# Patient Record
Sex: Male | Born: 1975 | Race: Black or African American | Hispanic: No | Marital: Single | Smoking: Never smoker
Health system: Southern US, Community
[De-identification: ages and names within clinical notes are randomized; demographics above are authoritative.]

## PROBLEM LIST (undated history)

## (undated) DIAGNOSIS — D571 Sickle-cell disease without crisis: Secondary | ICD-10-CM

## (undated) DIAGNOSIS — I10 Essential (primary) hypertension: Secondary | ICD-10-CM

## (undated) DIAGNOSIS — S5401XA Injury of ulnar nerve at forearm level, right arm, initial encounter: Secondary | ICD-10-CM

## (undated) DIAGNOSIS — E119 Type 2 diabetes mellitus without complications: Secondary | ICD-10-CM

## (undated) HISTORY — PX: KNEE ARTHROSCOPY: SHX127

## (undated) HISTORY — PX: ROTATOR CUFF REPAIR: SHX139

---

## 2013-03-21 DIAGNOSIS — M545 Low back pain, unspecified: Secondary | ICD-10-CM | POA: Insufficient documentation

## 2013-03-21 DIAGNOSIS — E1165 Type 2 diabetes mellitus with hyperglycemia: Secondary | ICD-10-CM | POA: Insufficient documentation

## 2013-03-21 DIAGNOSIS — E869 Volume depletion, unspecified: Secondary | ICD-10-CM | POA: Insufficient documentation

## 2013-03-21 DIAGNOSIS — H538 Other visual disturbances: Secondary | ICD-10-CM | POA: Insufficient documentation

## 2013-12-01 ENCOUNTER — Emergency Department: Payer: Self-pay | Admitting: Emergency Medicine

## 2013-12-01 LAB — CBC
HCT: 42.3 % (ref 40.0–52.0)
HGB: 13.2 g/dL (ref 13.0–18.0)
MCH: 25 pg — AB (ref 26.0–34.0)
MCHC: 31.2 g/dL — AB (ref 32.0–36.0)
MCV: 80 fL (ref 80–100)
Platelet: 314 10*3/uL (ref 150–440)
RBC: 5.27 10*6/uL (ref 4.40–5.90)
RDW: 19.3 % — ABNORMAL HIGH (ref 11.5–14.5)
WBC: 13.7 10*3/uL — ABNORMAL HIGH (ref 3.8–10.6)

## 2013-12-01 LAB — URINALYSIS, COMPLETE
BACTERIA: NONE SEEN
BLOOD: NEGATIVE
Bilirubin,UR: NEGATIVE
Ketone: NEGATIVE
Leukocyte Esterase: NEGATIVE
NITRITE: NEGATIVE
PROTEIN: NEGATIVE
Ph: 7 (ref 4.5–8.0)
RBC,UR: <1 /HPF (ref 0–5)
SPECIFIC GRAVITY: 1.02 (ref 1.003–1.030)
Squamous Epithelial: <1
WBC UR: <1 /HPF (ref 0–5)

## 2013-12-01 LAB — COMPREHENSIVE METABOLIC PANEL
ALBUMIN: 3.7 g/dL (ref 3.4–5.0)
ALK PHOS: 82 U/L
Anion Gap: 5 — ABNORMAL LOW (ref 7–16)
BUN: 17 mg/dL (ref 7–18)
Bilirubin,Total: 0.3 mg/dL (ref 0.2–1.0)
CALCIUM: 9.4 mg/dL (ref 8.5–10.1)
CREATININE: 1.4 mg/dL — AB (ref 0.60–1.30)
Chloride: 93 mmol/L — ABNORMAL LOW (ref 98–107)
Co2: 29 mmol/L (ref 21–32)
EGFR (Non-African Amer.): >60
Glucose: 602 mg/dL (ref 65–99)
OSMOLALITY: 285 (ref 275–301)
POTASSIUM: 4.3 mmol/L (ref 3.5–5.1)
SGOT(AST): 17 U/L (ref 15–37)
SGPT (ALT): 30 U/L (ref 12–78)
Sodium: 127 mmol/L — ABNORMAL LOW (ref 136–145)
TOTAL PROTEIN: 8.4 g/dL — AB (ref 6.4–8.2)

## 2013-12-01 LAB — TROPONIN I

## 2013-12-03 ENCOUNTER — Emergency Department: Payer: Self-pay | Admitting: Emergency Medicine

## 2016-05-30 ENCOUNTER — Emergency Department
Admission: EM | Admit: 2016-05-30 | Discharge: 2016-05-31 | Disposition: A | Discharge status: Home / Self Care | Payer: Self-pay | Attending: Emergency Medicine | Admitting: Emergency Medicine

## 2016-05-30 ENCOUNTER — Emergency Department: Payer: Self-pay

## 2016-05-30 DIAGNOSIS — R519 Headache, unspecified: Secondary | ICD-10-CM

## 2016-05-30 DIAGNOSIS — E119 Type 2 diabetes mellitus without complications: Secondary | ICD-10-CM | POA: Insufficient documentation

## 2016-05-30 DIAGNOSIS — R51 Headache: Secondary | ICD-10-CM | POA: Insufficient documentation

## 2016-05-30 DIAGNOSIS — M545 Low back pain: Secondary | ICD-10-CM | POA: Insufficient documentation

## 2016-05-30 LAB — CBC
HCT: 39 % — ABNORMAL LOW (ref 40.0–52.0)
HEMOGLOBIN: 13 g/dL (ref 13.0–18.0)
MCH: 26.7 pg (ref 26.0–34.0)
MCHC: 33.4 g/dL (ref 32.0–36.0)
MCV: 79.8 fL — ABNORMAL LOW (ref 80.0–100.0)
Platelets: 314 10*3/uL (ref 150–440)
RBC: 4.88 MIL/uL (ref 4.40–5.90)
RDW: 17.5 % — ABNORMAL HIGH (ref 11.5–14.5)
WBC: 18.2 10*3/uL — ABNORMAL HIGH (ref 3.8–10.6)

## 2016-05-30 NOTE — ED Triage Notes (Signed)
Patient reports woke today with generalized body aches and headache.  Patient denies any type of respiratory symptoms.

## 2016-05-31 LAB — BASIC METABOLIC PANEL
ANION GAP: 8 (ref 5–15)
BUN: 14 mg/dL (ref 6–20)
CHLORIDE: 100 mmol/L — AB (ref 101–111)
CO2: 28 mmol/L (ref 22–32)
Calcium: 9.2 mg/dL (ref 8.9–10.3)
Creatinine, Ser: 1.07 mg/dL (ref 0.61–1.24)
Glucose, Bld: 197 mg/dL — ABNORMAL HIGH (ref 65–99)
POTASSIUM: 3.9 mmol/L (ref 3.5–5.1)
SODIUM: 136 mmol/L (ref 135–145)

## 2016-05-31 LAB — INFLUENZA PANEL BY PCR (TYPE A & B)
H1N1FLUPCR: NOT DETECTED
INFLAPCR: NEGATIVE
INFLBPCR: NEGATIVE

## 2016-05-31 LAB — TROPONIN I

## 2016-05-31 MED ORDER — KETOROLAC TROMETHAMINE 30 MG/ML IJ SOLN
30.0000 mg | Freq: Once | INTRAMUSCULAR | Status: AC
  Administered 2016-05-31: 30 mg via INTRAVENOUS
  Filled 2016-05-31: qty 1

## 2016-05-31 MED ORDER — SODIUM CHLORIDE 0.9 % IV BOLUS (SEPSIS)
1000.0000 mL | Freq: Once | INTRAVENOUS | Status: AC
  Administered 2016-05-31: 1000 mL via INTRAVENOUS

## 2016-05-31 MED ORDER — DEXAMETHASONE SODIUM PHOSPHATE 10 MG/ML IJ SOLN
12.0000 mg | Freq: Once | INTRAMUSCULAR | Status: AC
  Administered 2016-05-31: 12 mg via INTRAVENOUS
  Filled 2016-05-31: qty 2

## 2016-05-31 MED ORDER — BUTALBITAL-APAP-CAFFEINE 50-325-40 MG PO TABS: 1.0000 | ORAL_TABLET | Freq: Four times a day (QID) | ORAL | 0 refills | Status: AC | PRN

## 2016-05-31 MED ORDER — MAGNESIUM SULFATE 2 GM/50ML IV SOLN
2.0000 g | Freq: Once | INTRAVENOUS | Status: AC
  Administered 2016-05-31: 2 g via INTRAVENOUS
  Filled 2016-05-31: qty 50

## 2016-05-31 MED ORDER — PROCHLORPERAZINE EDISYLATE 5 MG/ML IJ SOLN
10.0000 mg | Freq: Once | INTRAMUSCULAR | Status: AC
  Administered 2016-05-31: 10 mg via INTRAVENOUS
  Filled 2016-05-31: qty 2

## 2016-05-31 NOTE — ED Notes (Signed)
Pt states that he must have fallen asleep because he didn't remember me coming in and reconnecting his iv fluids, pt states that he is feeling better, cont to rest, lights turned now, NAD, cont to monitor

## 2016-05-31 NOTE — ED Provider Notes (Signed)
[facility] Emergency Department Provider Note   ____________________________________________   First MD Initiated Contact with Patient 05/30/16 2348     (approximate)  I have reviewed the triage vital signs and the nursing notes.   HISTORY  Chief Complaint Headache and Generalized Body Aches   HPI Carlos Hall is a 40 y.o. male with a history of migraine headaches was presented to emergency department today with diffuse body aches as well as a diffuse headache. He says that the headache started when he woke up this morning as a 5 out of 10. He says that the pain is throbbing and worse when he moves his head. He is denying any neck pain. Says that he also has diffuse body aches especially in his low back. Denies feeling sick lately. Denies any fever at home. Denies any stress or lack of sleep. Says that he has similar experience in 2011 when he was diagnosed with a migraine. Was unable to pinpoint the trigger for that event. Denies any drinking or tobacco use but does say that he smokes marijuana occasionally.   No past medical history on file.  There are no active problems to display for this patient.   No past surgical history on file.  Prior to Admission medications   Not on File    Allergies Review of patient's allergies indicates no known allergies.  No family history on file.  Social History Social History  Substance Use Topics  . Smoking status: Not on file  . Smokeless tobacco: Not on file  . Alcohol use Not on file    Review of Systems Constitutional: No fever/chills Eyes: No visual changes. ENT: No sore throat. Cardiovascular: Denies chest pain. Respiratory: Denies shortness of breath. Gastrointestinal: No abdominal pain.  No nausea, no vomiting.  No diarrhea.  No constipation. Genitourinary: Negative for dysuria. Musculoskeletal: Negative for back pain. Skin: Negative for rash. Neurological: Negative for focal weakness  or numbness.  10-point ROS otherwise negative.  ____________________________________________   PHYSICAL EXAM:  VITAL SIGNS: ED Triage Vitals  Enc Vitals Group     BP 05/30/16 2304 (!) 130/91     Pulse Rate 05/30/16 2304 78     Resp 05/30/16 2304 20     Temp 05/30/16 2304 98.1 F (36.7 C)     Temp Source 05/30/16 2304 Oral     SpO2 05/30/16 2304 100 %     Weight 05/30/16 2303 300 lb (136.1 kg)     Height 05/30/16 2303 5\' 11"  (1.803 m)     Head Circumference --      Peak Flow --      Pain Score 05/30/16 2303 10     Pain Loc --      Pain Edu? --      Excl. in GC? --    \ Constitutional: Alert and oriented. Well appearing and in no acute distress. Eyes: Conjunctivae are normal. PERRL. EOMI. Head: Atraumatic. Nose: No congestion/rhinnorhea. Mouth/Throat: Mucous membranes are moist.   Neck: No stridor.  Moves freely without any pain or restriction. Cardiovascular: Normal rate, regular rhythm. Grossly normal heart sounds.   Respiratory: Normal respiratory effort.  No retractions. Lungs CTAB. Gastrointestinal: Soft and nontender. No distention. No abdominal bruits. No CVA tenderness. Musculoskeletal: No lower extremity tenderness nor edema.  Tenderness to the low lumbar region. Neurologic:  Normal speech and language. No gross focal neurologic deficits are appreciated. Skin:  Skin is warm, dry and intact. No rash noted. Psychiatric: Mood and affect  are normal. Speech and behavior are normal.  ____________________________________________   LABS (all labs ordered are listed, but only abnormal results are displayed)  Labs Reviewed  BASIC METABOLIC PANEL - Abnormal; Notable for the following:       Result Value   Chloride 100 (*)    Glucose, Bld 197 (*)    All other components within normal limits  CBC - Abnormal; Notable for the following:    WBC 18.2 (*)    HCT 39.0 (*)    MCV 79.8 (*)    RDW 17.5 (*)    All other components within normal limits  TROPONIN I    INFLUENZA PANEL BY PCR (TYPE A & B, H1N1)   ____________________________________________  EKG  ED ECG REPORT I, Arelia LongestSchaevitz,  Fryda Molenda M, the attending physician, personally viewed and interpreted this ECG.   Date: 05/31/2016  EKG Time: 2302  Rate: 79  Rhythm: normal sinus rhythm  Axis: Normal  Intervals:none  ST&T Change: No ST segment elevation or depression. No abnormal T-wave inversion.  ____________________________________________  RADIOLOGY  DG Chest 2 View (Final result)  Result time 05/31/16 00:41:19  Final result by Rubye OaksMelanie Ehinger, MD (05/31/16 00:41:19)           Narrative:   CLINICAL DATA: Awoke today with chest pain and generalized body aches.  EXAM: CHEST 2 VIEW  COMPARISON: None.  FINDINGS: The cardiomediastinal contours are normal. The lungs are clear. Pulmonary vasculature is normal. No consolidation, pleural effusion, or pneumothorax. No acute osseous abnormalities are seen. Tacks in the right shoulder likely from rotator cuff repair.  IMPRESSION: No active cardiopulmonary disease.   Electronically Signed By: Rubye OaksMelanie Ehinger M.D. On: 05/31/2016 00:41          ____________________________________________   PROCEDURES  Procedure(s) performed:   Procedures  Critical Care performed:   ____________________________________________   INITIAL IMPRESSION / ASSESSMENT AND PLAN / ED COURSE  Pertinent labs & imaging results that were available during my care of the patient were reviewed by me and considered in my medical decision making (see chart for details).  White blood cell count of 18. Appears to have had an additionally elevated white blood cell count 2015.  ----------------------------------------- 1:23 AM on 05/31/2016 -----------------------------------------  Patient says that he is feeling slightly improved but still has 3 quarters of the bag of IV fluids to go. We'll reassess.  Clinical Course  Comment By Time   Patient says that his pain is down to 5 out of 10 after the first round of medications. We will give him second round of medications including Decadron and magnesium and an additional liter of fluid. Myrna Blazeravid Matthew Ellamay Fors, MD 10/09 [phone]    ----------------------------------------- 4:29 AM on 05/31/2016 -----------------------------------------  Patient now saying his pain is a 2-3 out of 10 in his head but completely relieved his body. Still not reporting any neck pain. Says that he feels off to go home at this point would like to be discharged. I also rechecked his temperature was 98.0. History of previous symptoms. Has a white blood cell count of 18.2 but no obvious infectious source. No lip lesions. No neck stiffness. Unlikely to be meningitis or encephalitis. His mental status intact. ____________________________________________   FINAL CLINICAL IMPRESSION(S) / ED DIAGNOSES  Headache.    NEW MEDICATIONS STARTED DURING THIS VISIT:  New Prescriptions   No medications on file     Note:  This document was prepared using Dragon voice recognition software and may include unintentional dictation errors.  Myrna Blazer, MD 05/31/16 0430

## 2016-05-31 NOTE — ED Notes (Signed)
Dr. Pershing ProudSchaevitz at pt's bedside at this time.

## 2016-05-31 NOTE — ED Notes (Signed)
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

## 2016-06-01 ENCOUNTER — Encounter: Payer: Self-pay | Admitting: Emergency Medicine

## 2016-06-01 ENCOUNTER — Emergency Department
Admission: EM | Admit: 2016-06-01 | Discharge: 2016-06-01 | Disposition: A | Discharge status: Home / Self Care | Payer: Self-pay | Attending: Emergency Medicine | Admitting: Emergency Medicine

## 2016-06-01 ENCOUNTER — Emergency Department: Payer: Self-pay

## 2016-06-01 DIAGNOSIS — R42 Dizziness and giddiness: Secondary | ICD-10-CM | POA: Insufficient documentation

## 2016-06-01 DIAGNOSIS — R519 Headache, unspecified: Secondary | ICD-10-CM

## 2016-06-01 DIAGNOSIS — R51 Headache: Secondary | ICD-10-CM | POA: Insufficient documentation

## 2016-06-01 DIAGNOSIS — E119 Type 2 diabetes mellitus without complications: Secondary | ICD-10-CM | POA: Insufficient documentation

## 2016-06-01 DIAGNOSIS — G4452 New daily persistent headache (NDPH): Secondary | ICD-10-CM

## 2016-06-01 HISTORY — DX: Injury of ulnar nerve at forearm level, right arm, initial encounter: S54.01XA

## 2016-06-01 HISTORY — DX: Type 2 diabetes mellitus without complications: E11.9

## 2016-06-01 LAB — CBC WITH DIFFERENTIAL/PLATELET
BASOS ABS: 0 10*3/uL (ref 0–0.1)
Basophils Relative: 0 %
EOS PCT: 1 %
Eosinophils Absolute: 0.1 10*3/uL (ref 0–0.7)
HEMATOCRIT: 38.3 % — AB (ref 40.0–52.0)
Hemoglobin: 12.9 g/dL — ABNORMAL LOW (ref 13.0–18.0)
LYMPHS ABS: 1.6 10*3/uL (ref 1.0–3.6)
LYMPHS PCT: 16 %
MCH: 26.8 pg (ref 26.0–34.0)
MCHC: 33.8 g/dL (ref 32.0–36.0)
MCV: 79.2 fL — AB (ref 80.0–100.0)
MONO ABS: 0.5 10*3/uL (ref 0.2–1.0)
Monocytes Relative: 5 %
NEUTROS ABS: 8.2 10*3/uL — AB (ref 1.4–6.5)
Neutrophils Relative %: 78 %
PLATELETS: 294 10*3/uL (ref 150–440)
RBC: 4.83 MIL/uL (ref 4.40–5.90)
RDW: 17.2 % — ABNORMAL HIGH (ref 11.5–14.5)
WBC: 10.5 10*3/uL (ref 3.8–10.6)

## 2016-06-01 LAB — BASIC METABOLIC PANEL
ANION GAP: 7 (ref 5–15)
BUN: 15 mg/dL (ref 6–20)
CHLORIDE: 102 mmol/L (ref 101–111)
CO2: 27 mmol/L (ref 22–32)
Calcium: 9 mg/dL (ref 8.9–10.3)
Creatinine, Ser: 1.05 mg/dL (ref 0.61–1.24)
GFR calc Af Amer: >60 mL/min (ref >60)
GFR calc non Af Amer: >60 mL/min (ref >60)
GLUCOSE: 163 mg/dL — AB (ref 65–99)
POTASSIUM: 3.9 mmol/L (ref 3.5–5.1)
Sodium: 136 mmol/L (ref 135–145)

## 2016-06-01 MED ORDER — METOCLOPRAMIDE HCL 5 MG/ML IJ SOLN
10.0000 mg | Freq: Once | INTRAMUSCULAR | Status: AC
  Administered 2016-06-01: 10 mg via INTRAVENOUS
  Filled 2016-06-01: qty 2

## 2016-06-01 MED ORDER — MECLIZINE HCL 25 MG PO TABS: 25.0000 mg | ORAL_TABLET | Freq: Three times a day (TID) | ORAL | 0 refills | Status: DC | PRN

## 2016-06-01 MED ORDER — RIZATRIPTAN BENZOATE 10 MG PO TABS: 10.0000 mg | ORAL_TABLET | Freq: Once | ORAL | 0 refills | Status: AC | PRN

## 2016-06-01 MED ORDER — KETOROLAC TROMETHAMINE 30 MG/ML IJ SOLN
30.0000 mg | Freq: Once | INTRAMUSCULAR | Status: AC
  Administered 2016-06-01: 30 mg via INTRAVENOUS
  Filled 2016-06-01: qty 1

## 2016-06-01 MED ORDER — DEXAMETHASONE SODIUM PHOSPHATE 10 MG/ML IJ SOLN
10.0000 mg | Freq: Once | INTRAMUSCULAR | Status: AC
  Administered 2016-06-01: 10 mg via INTRAVENOUS
  Filled 2016-06-01: qty 1

## 2016-06-01 NOTE — ED Triage Notes (Signed)
Patient reports headache since Sunday. Came here Sunday night left with headache. Has tried OTC allergy medicine. Patient reports eye pain and hearing problems off and on. Patient unable to sleep, pain worse when laying down.

## 2016-06-01 NOTE — ED Provider Notes (Signed)
[facility] Emergency Department Provider Note   ____________________________________________   None    (approximate)  I have reviewed the triage vital signs and the nursing notes.   HISTORY  Chief Complaint Headache    HPI Carlos Hall is a 40 y.o. male presents for evaluation of continued headache 2 days. Patient was here 2 days ago for the same states his headache slightly relieved but has continued. Patient states that his prescription for Fioricet is not touching his headache. He denies any nausea vomiting states that he is just unable to sleep and has episodes of vertigo or dizziness when he changes position.   Past Medical History:  Diagnosis Date  . 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 Diabetes mellitus without complication (HCC)    type 2    There are no active problems to display for this patient.   Past Surgical History:  Procedure Laterality Date  . KNEE ARTHROSCOPY    . ROTATOR CUFF REPAIR      Prior to Admission medications   Medication Sig Start Date End Date Taking? Authorizing Provider  butalbital-acetaminophen-caffeine (FIORICET, ESGIC) 50-325-40 MG tablet Take 1-2 tablets by mouth every 6 (six) hours as needed for headache. 05/31/16 05/31/17  Myrna Blazer, MD  meclizine (ANTIVERT) 25 MG tablet Take 1 tablet (25 mg total) by mouth 3 (three) times daily as needed for dizziness. 06/01/16   Evangeline Dakin, PA-C  rizatriptan (MAXALT) 10 MG tablet Take 1 tablet (10 mg total) by mouth once as needed for migraine. May repeat in 2 hours if needed 06/01/16 06/02/17  Evangeline Dakin, PA-C    Allergies Review of patient's allergies indicates no known allergies.  No family history on file.  Social History Social History  Substance Use Topics  . Smoking status: Never Smoker  . Smokeless tobacco: Never Used  . Alcohol use No    Review of Systems Constitutional: No fever/chills Eyes: No visual changes. ENT: No sore  throat. Cardiovascular: Denies chest pain. Respiratory: Denies shortness of breath. Gastrointestinal: No abdominal pain.  No nausea, no vomiting.  No diarrhea.  No constipation. Genitourinary: Negative for dysuria. Musculoskeletal: Negative for back pain. Skin: Negative for rash. Neurological: Negative for headaches, focal weakness or numbness.Positive for positional vertigo and dizziness.  10-point ROS otherwise negative.  ____________________________________________   PHYSICAL EXAM:  VITAL SIGNS: ED Triage Vitals [06/01/16 1121]  Enc Vitals Group     BP (!) 147/93     Pulse Rate 73     Resp 15     Temp 98.2 F (36.8 C)     Temp Source Oral     SpO2 97 %     Weight 300 lb (136.1 kg)     Height 5\' 11"  (1.803 m)     Head Circumference      Peak Flow      Pain Score 8     Pain Loc      Pain Edu?      Excl. in GC?     Constitutional: Alert and oriented. Well appearing and in no acute distress. Eyes: Conjunctivae are normal. PERRL. EOMI. Head: Atraumatic. Nose: No congestion/rhinnorhea. Mouth/Throat: Mucous membranes are moist.  Oropharynx non-erythematous. Neck: No stridor.   Cardiovascular: Normal rate, regular rhythm. Grossly normal heart sounds.  Good peripheral circulation. Respiratory: Normal respiratory effort.  No retractions. Lungs CTAB. Gastrointestinal: Soft and nontender. No distention. No abdominal bruits. No CVA tenderness. Musculoskeletal: No lower extremity tenderness nor edema.  No joint effusions. Neurologic:  Normal speech and language. No gross focal neurologic deficits are appreciated. No gait instability. Skin:  Skin is warm, dry and intact. No rash noted. Psychiatric: Mood and affect are normal. Speech and behavior are normal.  ____________________________________________   LABS (all labs ordered are listed, but only abnormal results are displayed)  Labs Reviewed  BASIC METABOLIC PANEL - Abnormal; Notable for the following:       Result Value    Glucose, Bld 163 (*)    All other components within normal limits  CBC WITH DIFFERENTIAL/PLATELET - Abnormal; Notable for the following:    Hemoglobin 12.9 (*)    HCT 38.3 (*)    MCV 79.2 (*)    RDW 17.2 (*)    Neutro Abs 8.2 (*)    All other components within normal limits   ____________________________________________  EKG   ____________________________________________  RADIOLOGY  Head CT demonstrates no acute intracranial process. ____________________________________________   PROCEDURES  Procedure(s) performed: None  Procedures  Critical Care performed: No  ____________________________________________   INITIAL IMPRESSION / ASSESSMENT AND PLAN / ED COURSE  Pertinent labs & imaging results that were available during my care of the patient were reviewed by me and considered in my medical decision making (see chart for details).  Recurrent headaches of etiology unknown. [facility]. Rx given for Maxalt and meclizine. Patient follow-up as directed. Patient states mild improvement from treatment with Toradol and Reglan and Decadron.  Clinical Course     ____________________________________________   FINAL CLINICAL IMPRESSION(S) / ED DIAGNOSES  Final diagnoses:  New daily persistent headache  Headache disorder      NEW MEDICATIONS STARTED DURING THIS VISIT:  New Prescriptions   MECLIZINE (ANTIVERT) 25 MG TABLET    Take 1 tablet (25 mg total) by mouth 3 (three) times daily as needed for dizziness.   RIZATRIPTAN (MAXALT) 10 MG TABLET    Take 1 tablet (10 mg total) by mouth once as needed for migraine. May repeat in 2 hours if needed     Note:  This document was prepared using Dragon voice recognition software and may include unintentional dictation errors.   Evangeline Dakinharles M Murline Weigel, PA-C 06/01/16 1320    Jene Everyobert Kinner, MD 06/01/16 [phone]

## 2016-09-22 ENCOUNTER — Emergency Department: Payer: Medicaid Other

## 2016-09-22 ENCOUNTER — Emergency Department
Admission: EM | Admit: 2016-09-22 | Discharge: 2016-09-22 | Disposition: A | Discharge status: Home / Self Care | Payer: Medicaid Other | Attending: Emergency Medicine | Admitting: Emergency Medicine

## 2016-09-22 ENCOUNTER — Encounter: Payer: Self-pay | Admitting: Emergency Medicine

## 2016-09-22 DIAGNOSIS — M25532 Pain in left wrist: Secondary | ICD-10-CM | POA: Diagnosis not present

## 2016-09-22 DIAGNOSIS — R531 Weakness: Secondary | ICD-10-CM | POA: Diagnosis not present

## 2016-09-22 DIAGNOSIS — E119 Type 2 diabetes mellitus without complications: Secondary | ICD-10-CM | POA: Insufficient documentation

## 2016-09-22 MED ORDER — MELOXICAM 15 MG PO TABS: 15.0000 mg | ORAL_TABLET | Freq: Every day | ORAL | 0 refills | Status: DC

## 2016-09-22 MED ORDER — TRAMADOL HCL 50 MG PO TABS: 50.0000 mg | ORAL_TABLET | Freq: Four times a day (QID) | ORAL | 0 refills | Status: DC | PRN
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
## 2016-09-22 NOTE — ED Triage Notes (Signed)
Presents with pain and swelling to left wrist w/o injury

## 2016-09-22 NOTE — Discharge Instructions (Signed)
Wear the splint when out of bed. Follow up with orthopedics when possible if pain is not improving. Return to the ER for symptoms that change or worsen.

## 2017-01-17 ENCOUNTER — Other Ambulatory Visit: Payer: Self-pay | Admitting: Orthopedic Surgery

## 2017-01-17 ENCOUNTER — Ambulatory Visit
Admission: RE | Admit: 2017-01-17 | Discharge: 2017-01-17 | Disposition: A | Discharge status: Home / Self Care | Payer: Disability Insurance | Source: Ambulatory Visit | Attending: Orthopedic Surgery | Admitting: Orthopedic Surgery

## 2017-01-17 ENCOUNTER — Ambulatory Visit
Admission: RE | Admit: 2017-01-17 | Discharge: 2017-01-17 | Disposition: A | Discharge status: Home / Self Care | Payer: Disability Insurance | Source: Ambulatory Visit | Attending: *Deleted | Admitting: *Deleted

## 2017-01-17 ENCOUNTER — Other Ambulatory Visit: Payer: Self-pay | Admitting: *Deleted

## 2017-01-17 DIAGNOSIS — M6281 Muscle weakness (generalized): Secondary | ICD-10-CM | POA: Diagnosis not present

## 2017-01-17 DIAGNOSIS — M25511 Pain in right shoulder: Secondary | ICD-10-CM

## 2017-01-17 DIAGNOSIS — R937 Abnormal findings on diagnostic imaging of other parts of musculoskeletal system: Secondary | ICD-10-CM | POA: Diagnosis not present

## 2017-01-17 DIAGNOSIS — E119 Type 2 diabetes mellitus without complications: Secondary | ICD-10-CM | POA: Insufficient documentation

## 2017-01-17 DIAGNOSIS — R200 Anesthesia of skin: Secondary | ICD-10-CM | POA: Insufficient documentation

## 2017-01-17 DIAGNOSIS — S8991XA Unspecified injury of right lower leg, initial encounter: Secondary | ICD-10-CM | POA: Diagnosis not present

## 2017-01-17 DIAGNOSIS — G589 Mononeuropathy, unspecified: Secondary | ICD-10-CM | POA: Insufficient documentation

## 2017-01-17 DIAGNOSIS — M25561 Pain in right knee: Secondary | ICD-10-CM

## 2017-01-17 DIAGNOSIS — X58XXXA Exposure to other specified factors, initial encounter: Secondary | ICD-10-CM | POA: Diagnosis not present

## 2017-08-23 ENCOUNTER — Encounter: Payer: Self-pay | Admitting: Emergency Medicine

## 2017-08-23 ENCOUNTER — Emergency Department
Admission: EM | Admit: 2017-08-23 | Discharge: 2017-08-23 | Disposition: A | Discharge status: Home / Self Care | Payer: Medicaid Other | Attending: Emergency Medicine | Admitting: Emergency Medicine

## 2017-08-23 DIAGNOSIS — E1065 Type 1 diabetes mellitus with hyperglycemia: Secondary | ICD-10-CM | POA: Diagnosis not present

## 2017-08-23 DIAGNOSIS — R739 Hyperglycemia, unspecified: Secondary | ICD-10-CM

## 2017-08-23 DIAGNOSIS — Z794 Long term (current) use of insulin: Secondary | ICD-10-CM | POA: Diagnosis not present

## 2017-08-23 DIAGNOSIS — Z791 Long term (current) use of non-steroidal anti-inflammatories (NSAID): Secondary | ICD-10-CM | POA: Diagnosis not present

## 2017-08-23 DIAGNOSIS — E109 Type 1 diabetes mellitus without complications: Secondary | ICD-10-CM

## 2017-08-23 LAB — BASIC METABOLIC PANEL WITH GFR
Anion gap: 9 (ref 5–15)
BUN: 16 mg/dL (ref 6–20)
CO2: 25 mmol/L (ref 22–32)
Calcium: 9 mg/dL (ref 8.9–10.3)
Chloride: 99 mmol/L — ABNORMAL LOW (ref 101–111)
Creatinine, Ser: 0.96 mg/dL (ref 0.61–1.24)
GFR calc Af Amer: >60 mL/min
GFR calc non Af Amer: >60 mL/min
Glucose, Bld: 295 mg/dL — ABNORMAL HIGH (ref 65–99)
Potassium: 3.9 mmol/L (ref 3.5–5.1)
Sodium: 133 mmol/L — ABNORMAL LOW (ref 135–145)

## 2017-08-23 LAB — CBC
HCT: 40 % (ref 40.0–52.0)
Hemoglobin: 13.1 g/dL (ref 13.0–18.0)
MCH: 26 pg (ref 26.0–34.0)
MCHC: 32.8 g/dL (ref 32.0–36.0)
MCV: 79.3 fL — ABNORMAL LOW (ref 80.0–100.0)
Platelets: 318 10*3/uL (ref 150–440)
RBC: 5.04 MIL/uL (ref 4.40–5.90)
RDW: 17.1 % — ABNORMAL HIGH (ref 11.5–14.5)
WBC: 12.2 10*3/uL — ABNORMAL HIGH (ref 3.8–10.6)

## 2017-08-23 LAB — GLUCOSE, CAPILLARY
GLUCOSE-CAPILLARY: 185 mg/dL — AB (ref 65–99)
Glucose-Capillary: 302 mg/dL — ABNORMAL HIGH (ref 65–99)
Glucose-Capillary: 155 mg/dL — ABNORMAL HIGH (ref 65–99)

## 2017-08-23 LAB — URINALYSIS, COMPLETE (UACMP) WITH MICROSCOPIC
Bacteria, UA: NONE SEEN
Bilirubin Urine: NEGATIVE
Glucose, UA: >=500 mg/dL — AB
Hgb urine dipstick: NEGATIVE
Ketones, ur: NEGATIVE mg/dL
Leukocytes, UA: NEGATIVE
Nitrite: NEGATIVE
Protein, ur: NEGATIVE mg/dL
Specific Gravity, Urine: 1.013 (ref 1.005–1.030)
Squamous Epithelial / HPF: NONE SEEN
pH: 6 (ref 5.0–8.0)

## 2017-08-23 MED ORDER — BLOOD GLUCOSE MONITOR KIT: PACK | 0 refills | Status: DC

## 2017-08-23 MED ORDER — SODIUM CHLORIDE 0.9 % IV BOLUS (SEPSIS)
1000.0000 mL | Freq: Once | INTRAVENOUS | Status: AC
  Administered 2017-08-23: 1000 mL via INTRAVENOUS

## 2017-08-23 MED ORDER — IBUPROFEN 600 MG PO TABS
600.0000 mg | ORAL_TABLET | ORAL | Status: AC
  Administered 2017-08-23: 600 mg via ORAL
  Filled 2017-08-23: qty 1

## 2017-08-23 MED ORDER — INSULIN ASPART 100 UNIT/ML ~~LOC~~ SOLN
8.0000 [IU] | Freq: Once | SUBCUTANEOUS | Status: AC
  Administered 2017-08-23: 8 [IU] via INTRAVENOUS
  Filled 2017-08-23: qty 1

## 2017-08-23 NOTE — ED Notes (Signed)
ED Provider at bedside. 

## 2017-08-23 NOTE — Discharge Instructions (Signed)
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
## 2017-08-23 NOTE — ED Provider Notes (Signed)
[facility] Emergency Department Provider Note ____________________________________________   First MD Initiated Contact with Patient 08/23/17 1501     (approximate)  I have reviewed the triage vital signs and the nursing notes.   HISTORY  Chief Complaint Hyperglycemia  HPI Carlos Hall is a 42 y.o. male presents for evaluation of increased urination, feeling thirsty, and feeling achy for about 1 week.  Patient reports that his glucometer stopped working about 1 week ago.  He has not been able to monitor his blood sugars closely, but believes they have been quite elevated.  He takes 7030 insulin twice daily, reports he uses 30 units at a time.  He has his prescriptions, but is not been able to administer his doses as regularly as his glucometer is not been working.  Denies fevers or chills.  No abdominal pain no nausea vomiting.  Reports he feels like he is "dehydrated".  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  No cough.  No runny nose.    Past Medical History:  Diagnosis Date  . 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 Diabetes mellitus without complication (HCC)    type 2    There are no active problems to display for this patient.   Past Surgical History:  Procedure Laterality Date  . KNEE ARTHROSCOPY    . ROTATOR CUFF REPAIR      Prior to Admission medications   Medication Sig Start Date End Date Taking? Authorizing Provider  meclizine (ANTIVERT) 25 MG tablet Take 1 tablet (25 mg total) by mouth 3 (three) times daily as needed for dizziness. 06/01/16   Beers, Charmayne Sheerharles M, PA-C  meloxicam (MOBIC) 15 MG tablet Take 1 tablet (15 mg total) by mouth daily. 09/22/16   Triplett, Rulon Eisenmengerari B, FNP  traMADol ([name]) 50 MG tablet Take 1 tablet (50 mg total) by mouth every 6 (six) hours as needed. 09/22/16   Kem Boroughsriplett, Cari B, FNP  Patient reports twice daily insulin use  Allergies Patient has no known allergies.  No  family history on file.  Social History Social History   Tobacco Use  . Smoking status: Never Smoker  . Smokeless tobacco: Never Used  Substance Use Topics  . Alcohol use: No  . Drug use: Yes    Types: Marijuana    Review of Systems Constitutional: No fever/chills but does feel fatigue Eyes: No visual changes. ENT: No sore throat.  Dry mouth. Cardiovascular: Denies chest pain. Respiratory: Denies shortness of breath. Gastrointestinal: No abdominal pain.  No nausea, no vomiting.  No diarrhea.  No constipation. Genitourinary: Negative for dysuria.  Frequent urination. Musculoskeletal: Negative for back pain. Skin: Negative for rash. Neurological: Negative for headaches, focal weakness or numbness.  ____________________________________________   PHYSICAL EXAM:  VITAL SIGNS: ED Triage Vitals  Enc Vitals Group     BP 08/23/17 1209 (!) 150/96     Pulse Rate 08/23/17 1209 92     Resp 08/23/17 1209 18     Temp 08/23/17 1209 98.4 F (36.9 C)     Temp Source 08/23/17 1209 Oral     SpO2 08/23/17 1209 99 %     Weight --      Height --      Head Circumference --      Peak Flow --      Pain Score 08/23/17 1217 7     Pain Loc --      Pain Edu? --  Excl. in [place]? --     Constitutional: Alert and oriented. Well appearing and in no acute distress.  Very pleasant Eyes: Conjunctivae are normal. Head: Atraumatic. Nose: No congestion/rhinnorhea. Mouth/Throat: Mucous membranes are dry. Neck: No stridor.   Cardiovascular: Normal rate, regular rhythm. Grossly normal heart sounds.  Good peripheral circulation. Respiratory: Normal respiratory effort.  No retractions. Lungs CTAB. Gastrointestinal: Soft and nontender. No distention. Musculoskeletal: No lower extremity tenderness nor edema. Neurologic:  Normal speech and language. No gross focal neurologic deficits are appreciated.  Skin:  Skin is warm, dry and intact. No rash noted. Psychiatric: Mood and affect are normal. Speech  and behavior are normal.  ____________________________________________   LABS (all labs ordered are listed, but only abnormal results are displayed)  Labs Reviewed  BASIC METABOLIC PANEL - Abnormal; Notable for the following components:      Result Value   Sodium 133 (*)    Chloride 99 (*)    Glucose, Bld 295 (*)    All other components within normal limits  CBC - Abnormal; Notable for the following components:   WBC 12.2 (*)    MCV 79.3 (*)    RDW 17.1 (*)    All other components within normal limits  URINALYSIS, COMPLETE (UACMP) WITH MICROSCOPIC - Abnormal; Notable for the following components:   Color, Urine YELLOW (*)    APPearance CLEAR (*)    Glucose, UA >=500 (*)    All other components within normal limits  GLUCOSE, CAPILLARY - Abnormal; Notable for the following components:   Glucose-Capillary 302 (*)    All other components within normal limits  GLUCOSE, CAPILLARY - Abnormal; Notable for the following components:   Glucose-Capillary 155 (*)    All other components within normal limits  CBG MONITORING, ED  CBG MONITORING, ED  CBG MONITORING, ED  CBG MONITORING, ED   ____________________________________________  EKG   ____________________________________________  RADIOLOGY   ____________________________________________   PROCEDURES  Procedure(s) performed: None  Procedures  Critical Care performed: No  ____________________________________________   INITIAL IMPRESSION / ASSESSMENT AND PLAN / ED COURSE  Pertinent labs & imaging results that were available during my care of the patient were reviewed by me and considered in my medical decision making (see chart for details).  Patient presents for evaluation of polydypsia polyuria.  Known diabetic and reports that he has not been able to administer his insulin as usual due to glucometer breaking.  Does report some slight muscle aches with a but no other infectious or systemic symptoms other than  feeling dry and dehydrated.  He mechanically stable, normal anion gap, normal potassium and bicarbonate.  No evidence of DKA.  No alteration in mental status.  We will hydrate, provide IV insulin, will recheck his blood sugar shortly thereafter and I suspect he should be able to go home and counseled him on purchasing a glucometer at [facility], for which she is agreeable and states that he has the funds to do.  He reports that he does not need a refill on his insulin, but rather broke glucometer.  Clinical Course as of Aug 23 1733  Tue Aug 23, 2017  1500 CO2: 25 [MQ]  1500 Potassium: 3.9 [MQ]  1500 Glucose: (!) 295 [MQ]  1500 Anion gap: 9 [MQ]    Clinical Course User Index [MQ] Sharyn Creamer, MD    No neurologic, cardiac pulmonary or infectious symptoms associated.   ----------------------------------------- 5:35 PM on 08/23/2017 -----------------------------------------  Patient blood glucose improved, resting comfortably.  Stable glucose  on recheck.  Patient reports he has insulin, just had not had his glucometer.  Counseled, advised on return precautions and need to go purchase his glucometer and continue to use it on a regular basis.  Patient in agreement.  Reports improvement, stable hemodynamics and no distress. ____________________________________________   FINAL CLINICAL IMPRESSION(S) / ED DIAGNOSES  Final diagnoses:  Type 1 diabetes mellitus without complication (HCC)  Hyperglycemia      NEW MEDICATIONS STARTED DURING THIS VISIT:  This SmartLink is deprecated. Use AVSMEDLIST instead to display the medication list for a patient.   Note:  This document was prepared using Dragon voice recognition software and may include unintentional dictation errors.     Sharyn Creamer, MD 08/23/17 1736

## 2017-11-03 ENCOUNTER — Encounter: Payer: Self-pay | Admitting: *Deleted

## 2017-11-03 DIAGNOSIS — H538 Other visual disturbances: Secondary | ICD-10-CM | POA: Diagnosis present

## 2017-11-03 DIAGNOSIS — E119 Type 2 diabetes mellitus without complications: Secondary | ICD-10-CM | POA: Insufficient documentation

## 2017-11-03 DIAGNOSIS — R739 Hyperglycemia, unspecified: Secondary | ICD-10-CM | POA: Insufficient documentation

## 2017-11-03 LAB — CBC
HEMATOCRIT: 42.4 % (ref 40.0–52.0)
Hemoglobin: 13.6 g/dL (ref 13.0–18.0)
MCH: 26.1 pg (ref 26.0–34.0)
MCHC: 32 g/dL (ref 32.0–36.0)
MCV: 81.3 fL (ref 80.0–100.0)
Platelets: 337 10*3/uL (ref 150–440)
RBC: 5.21 MIL/uL (ref 4.40–5.90)
RDW: 17.2 % — ABNORMAL HIGH (ref 11.5–14.5)
WBC: 13.1 10*3/uL — AB (ref 3.8–10.6)

## 2017-11-03 LAB — URINALYSIS, COMPLETE (UACMP) WITH MICROSCOPIC
BILIRUBIN URINE: NEGATIVE
Bacteria, UA: NONE SEEN
Glucose, UA: >=500 mg/dL — AB
HGB URINE DIPSTICK: NEGATIVE
KETONES UR: NEGATIVE mg/dL
LEUKOCYTES UA: NEGATIVE
NITRITE: NEGATIVE
Protein, ur: NEGATIVE mg/dL
RBC / HPF: NONE SEEN RBC/hpf (ref 0–5)
SPECIFIC GRAVITY, URINE: 1.022 (ref 1.005–1.030)
Squamous Epithelial / LPF: NONE SEEN
WBC, UA: NONE SEEN WBC/hpf (ref 0–5)
pH: 6 (ref 5.0–8.0)

## 2017-11-03 LAB — COMPREHENSIVE METABOLIC PANEL
ALBUMIN: 3.6 g/dL (ref 3.5–5.0)
ALK PHOS: 83 U/L (ref 38–126)
ALT: 23 U/L (ref 17–63)
ANION GAP: 11 (ref 5–15)
AST: 28 U/L (ref 15–41)
BILIRUBIN TOTAL: 0.4 mg/dL (ref 0.3–1.2)
BUN: 16 mg/dL (ref 6–20)
CO2: 25 mmol/L (ref 22–32)
Calcium: 9.2 mg/dL (ref 8.9–10.3)
Chloride: 93 mmol/L — ABNORMAL LOW (ref 101–111)
Creatinine, Ser: 1.15 mg/dL (ref 0.61–1.24)
GFR calc non Af Amer: >60 mL/min (ref >60)
GLUCOSE: 692 mg/dL — AB (ref 65–99)
POTASSIUM: 4 mmol/L (ref 3.5–5.1)
SODIUM: 129 mmol/L — AB (ref 135–145)
TOTAL PROTEIN: 7.8 g/dL (ref 6.5–8.1)

## 2017-11-03 LAB — GLUCOSE, CAPILLARY: Glucose-Capillary: >600 mg/dL (ref 65–99)

## 2017-11-03 MED ORDER — SODIUM CHLORIDE 0.9 % IV BOLUS (SEPSIS)
1000.0000 mL | Freq: Once | INTRAVENOUS | Status: AC
  Administered 2017-11-03: 1000 mL via INTRAVENOUS

## 2017-11-03 NOTE — ED Triage Notes (Signed)
Pt reports he feels like his blood sugar is elevated because he has been urinating frequently, has blurry vision in both eyes and has body aches. He has not taken insulin in "about 2-3 years" because he cannot afford it.   Glucose reading "hi" in triage.

## 2017-11-04 ENCOUNTER — Emergency Department
Admission: EM | Admit: 2017-11-04 | Discharge: 2017-11-04 | Disposition: A | Discharge status: Home / Self Care | Payer: Medicaid Other | Attending: Emergency Medicine | Admitting: Emergency Medicine

## 2017-11-04 DIAGNOSIS — R739 Hyperglycemia, unspecified: Secondary | ICD-10-CM

## 2017-11-04 LAB — GLUCOSE, CAPILLARY
GLUCOSE-CAPILLARY: 340 mg/dL — AB (ref 65–99)
GLUCOSE-CAPILLARY: 347 mg/dL — AB (ref 65–99)

## 2017-11-04 MED ORDER — SODIUM CHLORIDE 0.9 % IV BOLUS (SEPSIS)
1000.0000 mL | Freq: Once | INTRAVENOUS | Status: AC
  Administered 2017-11-04: 1000 mL via INTRAVENOUS

## 2017-11-04 MED ORDER — INSULIN ASPART 100 UNIT/ML ~~LOC~~ SOLN: SUBCUTANEOUS | 3 refills | Status: DC

## 2017-11-04 MED ORDER — INSULIN ASPART 100 UNIT/ML ~~LOC~~ SOLN
10.0000 [IU] | Freq: Once | SUBCUTANEOUS | Status: AC
  Administered 2017-11-04: 10 [IU] via INTRAVENOUS
  Filled 2017-11-04: qty 1

## 2017-11-04 NOTE — ED Provider Notes (Signed)
[facility] Emergency Department Provider Note _   First MD Initiated Contact with Patient 11/04/17 0023     (approximate)  I have reviewed the triage vital signs and the nursing notes.   HISTORY  Chief Complaint Blurred Vision   HPI Carlos Hall is a 42 y.o. male with history of diabetes mellitus type 2 presents to the emergency department with polydipsia polyuria and markedly elevated glucose accompanied by bilateral blurred vision.  Patient states that he has been without his insulin for greater than 1 week secondary to cost.  Patient denies any recent illness   Past Medical History:  Diagnosis Date  . 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 Diabetes mellitus without complication ([place])    type 2    There are no active problems to display for this patient.   Past Surgical History:  Procedure Laterality Date  . KNEE ARTHROSCOPY    . ROTATOR CUFF REPAIR      Prior to Admission medications   Medication Sig Start Date End Date Taking? Authorizing Provider  blood glucose meter kit and supplies KIT Dispense based on patient and insurance preference. Use up to four times daily as directed. (FOR ICD-9 250.00, 250.01). 08/23/17  Yes Delman Kitten, MD    Allergies No known drug allergies No family history on file.  Social History Social History   Tobacco Use  . Smoking status: Never Smoker  . Smokeless tobacco: Never Used  Substance Use Topics  . Alcohol use: No  . Drug use: Yes    Types: Marijuana    Review of Systems Constitutional: No fever/chills Eyes: No visual changes. ENT: No sore throat. Cardiovascular: Denies chest pain. Respiratory: Denies shortness of breath. Gastrointestinal: No abdominal pain.  No nausea, no vomiting.  No diarrhea.  No constipation. Genitourinary: Negative for dysuria. Musculoskeletal: Negative for neck pain.  Negative for back pain. Integumentary: Negative for rash. Neurological: Negative for headaches, focal  weakness or numbness. Endocrine:Positive for hyperglycemia polyuria polydipsia   ____________________________________________   PHYSICAL EXAM:  VITAL SIGNS: ED Triage Vitals  Enc Vitals Group     BP 11/03/17 2219 (!) 140/95     Pulse Rate 11/03/17 2219 (!) 122     Resp 11/03/17 2219 18     Temp 11/03/17 2219 99.2 F (37.3 C)     Temp Source 11/03/17 2219 Oral     SpO2 11/03/17 2219 100 %     Weight 11/03/17 2219 136.1 kg (300 lb)     Height 11/03/17 2219 1.803 m ('5\' 11"'$ )     Head Circumference --      Peak Flow --      Pain Score 11/03/17 2222 7     Pain Loc --      Pain Edu? --      Excl. in [place]? --     Constitutional: Alert and oriented. Well appearing and in no acute distress. Eyes: Conjunctivae are normal.  Head: Atraumatic. Mouth/Throat: Mucous membranes are dry. Oropharynx non-erythematous. Neck: No stridor.   Cardiovascular: Normal rate, regular rhythm. Good peripheral circulation. Grossly normal heart sounds. Respiratory: Normal respiratory effort.  No retractions. Lungs CTAB. Gastrointestinal: Soft and nontender. No distention.  Musculoskeletal: No lower extremity tenderness nor edema. No gross deformities of extremities. Neurologic:  Normal speech and language. No gross focal neurologic deficits are appreciated.  Skin:  Skin is warm, dry and intact. No rash noted. Psychiatric: Mood and affect are normal. Speech and behavior are normal.  ____________________________________________  LABS (all labs ordered are listed, but only abnormal results are displayed)  Labs Reviewed  GLUCOSE, CAPILLARY - Abnormal; Notable for the following components:      Result Value   Glucose-Capillary >600 (*)    All other components within normal limits  CBC - Abnormal; Notable for the following components:   WBC 13.1 (*)    RDW 17.2 (*)    All other components within normal limits  URINALYSIS, COMPLETE (UACMP) WITH MICROSCOPIC - Abnormal; Notable for the following  components:   Color, Urine COLORLESS (*)    APPearance CLEAR (*)    Glucose, UA >=500 (*)    All other components within normal limits  COMPREHENSIVE METABOLIC PANEL - Abnormal; Notable for the following components:   Sodium 129 (*)    Chloride 93 (*)    Glucose, Bld 692 (*)    All other components within normal limits  GLUCOSE, CAPILLARY - Abnormal; Notable for the following components:   Glucose-Capillary >600 (*)    All other components within normal limits  CBG MONITORING, ED   ____________________________________________  EKG  ED ECG REPORT I, Kenvir N Ashaya Raftery, the attending physician, personally viewed and interpreted this ECG.   Date: 11/04/2017  EKG Time: 10:27 PM  Rate: 126  Rhythm: Sinus tachycardia  Axis: Normal  Intervals: Normal  ST&T Change: None   Procedures   ____________________________________________   INITIAL IMPRESSION / ASSESSMENT AND PLAN / ED COURSE  As part of my medical decision making, I reviewed the following data within the electronic MEDICAL RECORD NUMBER    42 year old male presented with above-stated history and physical exam secondary to hyperglycemia due to inability to purchase insulin.  Patient received 2 L IV normal saline and 10 units of IV insulin in the emergency department with improvement of hyperglycemia to 347.  Patient's initial glucose was 692.  Patient prescribed insulin and [facility] [place]. ____________________________________________  FINAL CLINICAL IMPRESSION(S) / ED DIAGNOSES  Final diagnoses:  Hyperglycemia     MEDICATIONS GIVEN DURING THIS VISIT:  Medications  sodium chloride 0.9 % bolus 1,000 mL (0 mLs Intravenous Stopped 11/03/17 2340)  insulin aspart (novoLOG) injection 10 Units (10 Units Intravenous Given 11/04/17 0040)  sodium chloride 0.9 % bolus 1,000 mL (1,000 mLs Intravenous New Bag/Given 11/04/17 0041)     ED Discharge Orders    None       Note:  This  document was prepared using Dragon voice recognition software and may include unintentional dictation errors.    Gregor Hams, MD 11/04/17 [phone]

## 2017-11-04 NOTE — ED Notes (Signed)
ED Provider at bedside. 

## 2017-11-04 NOTE — ED Notes (Signed)
Informed RN that patient has been roomed and is ready for evaluation.  Patient in NAD at this time and call bell placed within reach.   

## 2017-12-15 ENCOUNTER — Other Ambulatory Visit: Payer: Self-pay

## 2017-12-15 ENCOUNTER — Encounter: Payer: Self-pay | Admitting: Emergency Medicine

## 2017-12-15 ENCOUNTER — Emergency Department
Admission: EM | Admit: 2017-12-15 | Discharge: 2017-12-15 | Disposition: A | Discharge status: Home / Self Care | Payer: Medicaid Other | Attending: Emergency Medicine | Admitting: Emergency Medicine

## 2017-12-15 DIAGNOSIS — E119 Type 2 diabetes mellitus without complications: Secondary | ICD-10-CM | POA: Insufficient documentation

## 2017-12-15 DIAGNOSIS — Z794 Long term (current) use of insulin: Secondary | ICD-10-CM | POA: Insufficient documentation

## 2017-12-15 DIAGNOSIS — R51 Headache: Secondary | ICD-10-CM | POA: Diagnosis present

## 2017-12-15 DIAGNOSIS — G43009 Migraine without aura, not intractable, without status migrainosus: Secondary | ICD-10-CM | POA: Diagnosis not present

## 2017-12-15 MED ORDER — METOCLOPRAMIDE HCL 5 MG/ML IJ SOLN
10.0000 mg | INTRAMUSCULAR | Status: AC
  Administered 2017-12-15: 10 mg via INTRAVENOUS
  Filled 2017-12-15: qty 2

## 2017-12-15 MED ORDER — SODIUM CHLORIDE 0.9 % IV BOLUS
1000.0000 mL | INTRAVENOUS | Status: AC
  Administered 2017-12-15: 1000 mL via INTRAVENOUS

## 2017-12-15 MED ORDER — DEXAMETHASONE SODIUM PHOSPHATE 10 MG/ML IJ SOLN
10.0000 mg | Freq: Once | INTRAMUSCULAR | Status: AC
  Administered 2017-12-15: 10 mg via INTRAVENOUS
  Filled 2017-12-15: qty 1

## 2017-12-15 MED ORDER — DIPHENHYDRAMINE HCL 50 MG/ML IJ SOLN
25.0000 mg | INTRAMUSCULAR | Status: AC
  Administered 2017-12-15: 25 mg via INTRAVENOUS
  Filled 2017-12-15: qty 1

## 2017-12-15 MED ORDER — KETOROLAC TROMETHAMINE 30 MG/ML IJ SOLN
15.0000 mg | Freq: Once | INTRAMUSCULAR | Status: AC
  Administered 2017-12-15: 15 mg via INTRAVENOUS
  Filled 2017-12-15: qty 1

## 2017-12-15 MED ORDER — ACETAMINOPHEN 500 MG PO TABS
1000.0000 mg | ORAL_TABLET | Freq: Once | ORAL | Status: AC
  Administered 2017-12-15: 1000 mg via ORAL
  Filled 2017-12-15: qty 2

## 2017-12-15 NOTE — ED Notes (Signed)

## 2017-12-15 NOTE — Discharge Instructions (Signed)

## 2017-12-15 NOTE — ED Triage Notes (Signed)
Patient ambulatory to triage with steady gait, without difficulty or distress noted; pt reports x 2 days having frontal HA with hx of same

## 2017-12-15 NOTE — ED Notes (Addendum)
Pt placed on non-rebreather by MD.

## 2017-12-15 NOTE — ED Provider Notes (Addendum)
[facility] Emergency Department Provider Note  ____________________________________________   First MD Initiated Contact with Patient 12/15/17 [phone]     (approximate)  I have reviewed the triage vital signs and the nursing notes.   HISTORY  Chief Complaint No chief complaint on file.    HPI Carlos Roza. is a 42 y.o. male with past medical history that does include headaches/migraines who presents for evaluation of persistent headache x2 days.  He states that it is in the front of his head and feels like a sharp and throbbing pain.  It was gradual in onset over the last 2 days and waxes and wanes in severity but never goes away.  There was no thunderclap event and no acute onset.  Bright lights make it worse and he also feels worse after he has been asleep and lying down.  No visual changes.  No nausea, no vomiting.  No neck pain or stiffness.  Denies fever/chills, chest pain, shortness of breath, abdominal pain, and dysuria.  He states it feels similar to the migraines for which he presented a couple times about 2 years ago.  He takes no medication regularly for headaches.  Past Medical History:  Diagnosis Date  . 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 Diabetes mellitus without complication ([place])    type 2    There are no active problems to display for this patient.   Past Surgical History:  Procedure Laterality Date  . KNEE ARTHROSCOPY    . ROTATOR CUFF REPAIR      Prior to Admission medications   Medication Sig Start Date End Date Taking? Authorizing Provider  blood glucose meter kit and supplies KIT Dispense based on patient and insurance preference. Use up to four times daily as directed. (FOR ICD-9 250.00, 250.01). 08/23/17   Delman Kitten, MD  insulin aspart (NOVOLOG) 100 UNIT/ML injection 30 units twice daily 11/04/17 11/04/18  Gregor Hams, MD  insulin aspart protamine- aspart (NOVOLOG MIX 70/30) (70-30) 100 UNIT/ML injection Inject 30 Units  into the skin 2 (two) times daily with a meal.    [provider]    Allergies Patient has no known allergies.  No family history on file.  Social History Social History   Tobacco Use  . Smoking status: Never Smoker  . Smokeless tobacco: Never Used  Substance Use Topics  . Alcohol use: No  . Drug use: Yes    Types: Marijuana    Review of Systems Constitutional: No fever/chills Eyes: No visual changes. ENT: No sore throat. Cardiovascular: Denies chest pain. Respiratory: Denies shortness of breath. Gastrointestinal: No abdominal pain.  No nausea, no vomiting.  No diarrhea.  No constipation. Genitourinary: Negative for dysuria. Musculoskeletal: Negative for neck pain.  Negative for back pain. Integumentary: Negative for rash. Neurological:    ____________________________________________   PHYSICAL EXAM:  VITAL SIGNS: ED Triage Vitals  Enc Vitals Group     BP 12/15/17 0358 (!) 155/97     Pulse Rate 12/15/17 0358 82     Resp 12/15/17 0358 18     Temp 12/15/17 0358 98 F (36.7 C)     Temp Source 12/15/17 0358 Oral     SpO2 12/15/17 0358 99 %     Weight 12/15/17 0357 136.1 kg (300 lb)     Height 12/15/17 0357 1.803 m ('5\' 11"'$ )     Head Circumference --      Peak Flow --      Pain Score 12/15/17  0357 10     Pain Loc --      Pain Edu? --      Excl. in [place]? --     Constitutional: Alert and oriented. Well appearing and in no acute distress. Eyes: Conjunctivae are normal. PERRL. EOMI. Head: Atraumatic. Nose: No congestion/rhinnorhea. Mouth/Throat: Mucous membranes are moist. Neck: No stridor.  No meningeal signs.   Cardiovascular: Normal rate, regular rhythm. Good peripheral circulation. Grossly normal heart sounds. Respiratory: Normal respiratory effort.  No retractions. Lungs CTAB. Gastrointestinal: Soft and nontender. No distention.  Musculoskeletal: No lower extremity tenderness nor edema. No gross deformities of extremities. Neurologic:  Normal  speech and language. No gross focal neurologic deficits are appreciated.  Skin:  Skin is warm, dry and intact. No rash noted. Psychiatric: Mood and affect are normal. Speech and behavior are normal.  ____________________________________________   LABS (all labs ordered are listed, but only abnormal results are displayed)  Labs Reviewed - No data to display ____________________________________________  EKG  None - EKG not ordered by ED physician ____________________________________________  RADIOLOGY   ED MD interpretation: No indication for imaging  Official radiology report(s): No results found.  ____________________________________________   PROCEDURES  Critical Care performed: No   Procedure(s) performed:   Procedures   ____________________________________________   INITIAL IMPRESSION / ASSESSMENT AND PLAN / ED COURSE  As part of my medical decision making, I reviewed the following data within the [facility] notes reviewed and incorporated, Old chart reviewed and Notes from prior ED visits    Differential diagnosis includes, but is not limited to, intracranial hemorrhage, meningitis/encephalitis, previous head trauma, cavernous venous thrombosis, tension headache, temporal arteritis, migraine or migraine equivalent, idiopathic intracranial hypertension, and non-specific headache.  The patient has presented with similar headaches in the past and he had a head CT 2 years ago which is reassuring.  It seems that he is suffering from either migraine or possibly a cluster headache based on the fact that he is a large, obese man and the headache seems to be worse after he is asleep.  He has no neurological deficits which is reassuring.  We discussed various treatment options and we agreed that we would try a nonrebreather at 15 L for about 30 minutes and see if this treats his headache which would suggest that it is a cluster headache.  If this is  unsuccessful we will proceed with standard migraine treatment.  I do not think imaging will be beneficial at this time.  His vital signs are reassuring except for some hypertension and he has no signs or symptoms of infection.  He understands and agrees with the plan.  Clinical Course as of Dec 16 639  Thu Dec 15, 2017  0459 Unfortunately 30 minutes on a nonrebreather at 15 L did not help, so it is unlikely that a cluster headache is the cause of the patient's discomfort.  We will proceed with migraine treatment with the following: Metoclopramide 10 mg IV, Benadryl 25 mill grams IV, Toradol 50 mg IV, 1 L of normal saline IV, Decadron 10 mg IV, Tylenol 1000 mg p.o.   [CF]  9604 The patient states he feels much better and has been sleeping comfortably.  He is comfortable with the plan to go home.  I gave my usual and customary return precautions.    [CF]    Clinical Course User Index [CF] Hinda Kehr, MD    ____________________________________________  FINAL CLINICAL IMPRESSION(S) / ED DIAGNOSES  Final diagnoses:  Migraine without aura and without status migrainosus, not intractable     MEDICATIONS GIVEN DURING THIS VISIT:  Medications  acetaminophen (TYLENOL) tablet 1,000 mg (1,000 mg Oral Given 12/15/17 0520)  metoCLOPramide (REGLAN) injection 10 mg (10 mg Intravenous Given 12/15/17 0520)  diphenhydrAMINE (BENADRYL) injection 25 mg (25 mg Intravenous Given 12/15/17 0520)  ketorolac (TORADOL) 30 MG/ML injection 15 mg (15 mg Intravenous Given 12/15/17 0520)  dexamethasone (DECADRON) injection 10 mg (10 mg Intravenous Given 12/15/17 0520)  sodium chloride 0.9 % bolus 1,000 mL (1,000 mLs Intravenous New Bag/Given 12/15/17 0520)     ED Discharge Orders    None       Note:  This document was prepared using Dragon voice recognition software and may include unintentional dictation errors.    Hinda Kehr, MD 12/15/17 Starr Sinclair    Hinda Kehr, MD 12/15/17 [phone]

## 2018-03-23 ENCOUNTER — Emergency Department
Admission: EM | Admit: 2018-03-23 | Discharge: 2018-03-23 | Disposition: A | Discharge status: Home / Self Care | Payer: Medicaid Other | Source: Home / Self Care | Attending: Emergency Medicine | Admitting: Emergency Medicine

## 2018-03-23 ENCOUNTER — Other Ambulatory Visit: Payer: Self-pay

## 2018-03-23 DIAGNOSIS — G43801 Other migraine, not intractable, with status migrainosus: Secondary | ICD-10-CM | POA: Insufficient documentation

## 2018-03-23 DIAGNOSIS — Z794 Long term (current) use of insulin: Secondary | ICD-10-CM | POA: Insufficient documentation

## 2018-03-23 DIAGNOSIS — Z79899 Other long term (current) drug therapy: Secondary | ICD-10-CM | POA: Insufficient documentation

## 2018-03-23 DIAGNOSIS — E119 Type 2 diabetes mellitus without complications: Secondary | ICD-10-CM

## 2018-03-23 LAB — CBC WITH DIFFERENTIAL/PLATELET
Basophils Absolute: 0 10*3/uL (ref 0–0.1)
Basophils Relative: 0 %
EOS ABS: 0 10*3/uL (ref 0–0.7)
Eosinophils Relative: 0 %
HCT: 40.7 % (ref 40.0–52.0)
HEMOGLOBIN: 13.4 g/dL (ref 13.0–18.0)
LYMPHS ABS: 1.2 10*3/uL (ref 1.0–3.6)
Lymphocytes Relative: 8 %
MCH: 26.4 pg (ref 26.0–34.0)
MCHC: 33 g/dL (ref 32.0–36.0)
MCV: 79.9 fL — ABNORMAL LOW (ref 80.0–100.0)
MONO ABS: 0.8 10*3/uL (ref 0.2–1.0)
Monocytes Relative: 5 %
NEUTROS PCT: 87 %
Neutro Abs: 14.1 10*3/uL — ABNORMAL HIGH (ref 1.4–6.5)
Platelets: 309 10*3/uL (ref 150–440)
RBC: 5.1 MIL/uL (ref 4.40–5.90)
RDW: 17.1 % — ABNORMAL HIGH (ref 11.5–14.5)
WBC: 16.2 10*3/uL — ABNORMAL HIGH (ref 3.8–10.6)

## 2018-03-23 LAB — BASIC METABOLIC PANEL
Anion gap: 8 (ref 5–15)
BUN: 14 mg/dL (ref 6–20)
CHLORIDE: 97 mmol/L — AB (ref 98–111)
CO2: 31 mmol/L (ref 22–32)
CREATININE: 1.02 mg/dL (ref 0.61–1.24)
Calcium: 9.3 mg/dL (ref 8.9–10.3)
GFR calc non Af Amer: >60 mL/min (ref >60)
Glucose, Bld: 322 mg/dL — ABNORMAL HIGH (ref 70–99)
Potassium: 4.2 mmol/L (ref 3.5–5.1)
Sodium: 136 mmol/L (ref 135–145)

## 2018-03-23 MED ORDER — ACETAMINOPHEN 500 MG PO TABS
1000.0000 mg | ORAL_TABLET | Freq: Once | ORAL | Status: AC
  Administered 2018-03-23: 1000 mg via ORAL
  Filled 2018-03-23: qty 2

## 2018-03-23 MED ORDER — METOCLOPRAMIDE HCL 5 MG/ML IJ SOLN
10.0000 mg | Freq: Once | INTRAMUSCULAR | Status: AC
  Administered 2018-03-23: 10 mg via INTRAVENOUS
  Filled 2018-03-23: qty 2

## 2018-03-23 MED ORDER — SODIUM CHLORIDE 0.9 % IV BOLUS
1000.0000 mL | Freq: Once | INTRAVENOUS | Status: AC
  Administered 2018-03-23: 1000 mL via INTRAVENOUS

## 2018-03-23 MED ORDER — METOCLOPRAMIDE HCL 10 MG PO TABS: 10.0000 mg | ORAL_TABLET | Freq: Four times a day (QID) | ORAL | 0 refills | Status: DC | PRN

## 2018-03-23 MED ORDER — KETOROLAC TROMETHAMINE 30 MG/ML IJ SOLN
15.0000 mg | INTRAMUSCULAR | Status: AC
  Administered 2018-03-23: 15 mg via INTRAVENOUS
  Filled 2018-03-23: qty 1

## 2018-03-23 MED ORDER — FAMOTIDINE 20 MG PO TABS: 20.0000 mg | ORAL_TABLET | Freq: Two times a day (BID) | ORAL | 0 refills | Status: DC

## 2018-03-23 MED ORDER — NAPROXEN 500 MG PO TABS: 500.0000 mg | ORAL_TABLET | Freq: Two times a day (BID) | ORAL | 0 refills | Status: DC

## 2018-03-23 MED ORDER — DIPHENHYDRAMINE HCL 50 MG/ML IJ SOLN
50.0000 mg | Freq: Once | INTRAMUSCULAR | Status: AC
  Administered 2018-03-23: 50 mg via INTRAVENOUS
  Filled 2018-03-23: qty 1

## 2018-03-23 MED ORDER — ONDANSETRON HCL 4 MG/2ML IJ SOLN
4.0000 mg | Freq: Once | INTRAMUSCULAR | Status: AC
Start: 2018-03-23 — End: 2018-03-23
  Administered 2018-03-23: 4 mg via INTRAVENOUS
  Filled 2018-03-23: qty 2

## 2018-03-23 MED ORDER — BUTALBITAL-APAP-CAFFEINE 50-325-40 MG PO TABS: 1.0000 | ORAL_TABLET | Freq: Four times a day (QID) | ORAL | 0 refills | Status: DC | PRN

## 2018-03-23 MED ORDER — MAGNESIUM SULFATE 2 GM/50ML IV SOLN
2.0000 g | INTRAVENOUS | Status: AC
  Administered 2018-03-23: 2 g via INTRAVENOUS
  Filled 2018-03-23: qty 50

## 2018-03-23 NOTE — ED Notes (Signed)
See Triage note: Has tried Excedrin migraine with no relief. No hx headaches, per pt.

## 2018-03-23 NOTE — ED Triage Notes (Signed)
Pt c/o HA that began yesterday. States pain behind both eyes. Took excedrin at home but "it didn't work so that's why I came here." states nausea. Sensitivity to lights. Alert, oriented, speaking in complete sentences.

## 2018-03-23 NOTE — ED Provider Notes (Signed)
[facility] Emergency Department Provider Note  ____________________________________________  Time seen: Approximately 8:54 PM  I have reviewed the triage vital signs and the nursing notes.   HISTORY  Chief Complaint Headache    HPI Carlos E Haynesworth Jr. is a 42 y.o. male with a history of diabetes and migraine headaches who complains of bilateral frontal headache, retro-orbital, started yesterday.  Constant, worse with lights and noises.  No alleviating factors.  Tried NSAIDs at home without relief.  Nonradiating.  No neck stiffness or neck pain or fevers or chills.  No other complaints at this time.  No recent trauma  Past Medical History:  Diagnosis Date  . 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 Diabetes mellitus without complication (HCC)    type 2     There are no active problems to display for this patient.    Past Surgical History:  Procedure Laterality Date  . KNEE ARTHROSCOPY    . ROTATOR CUFF REPAIR       Prior to Admission medications   Medication Sig Start Date End Date Taking? Authorizing Provider  blood glucose meter kit and supplies KIT Dispense based on patient and insurance preference. Use up to four times daily as directed. (FOR ICD-9 250.00, 250.01). 08/23/17   Quale, Mark, MD  famotidine (PEPCID) 20 MG tablet Take 1 tablet (20 mg total) by mouth 2 (two) times daily. 03/23/18   Stafford, Phillip, MD  insulin aspart (NOVOLOG) 100 UNIT/ML injection 30 units twice daily 11/04/17 11/04/18  Brown, [place] N, MD  insulin aspart protamine- aspart (NOVOLOG MIX 70/30) (70-30) 100 UNIT/ML injection Inject 30 Units into the skin 2 (two) times daily with a meal.    [provider]  metoCLOPramide (REGLAN) 10 MG tablet Take 1 tablet (10 mg total) by mouth every 6 (six) hours as needed. 03/23/18   Stafford, Phillip, MD  naproxen (NAPROSYN) 500 MG tablet Take 1 tablet (500 mg total) by mouth 2 (two) times daily with a meal. 03/23/18   Stafford,  Phillip, MD     Allergies Patient has no known allergies.   History reviewed. No pertinent family history.  Social History Social History   Tobacco Use  . Smoking status: Never Smoker  . Smokeless tobacco: Never Used  Substance Use Topics  . Alcohol use: Yes  . Drug use: Yes    Types: Marijuana    Review of Systems  Constitutional:   No fever or chills.  ENT:   No sore throat. No rhinorrhea. Cardiovascular:   No chest pain or syncope. Respiratory:   No dyspnea or cough. Gastrointestinal:   Negative for abdominal pain, vomiting and diarrhea.  Musculoskeletal:   Negative for focal pain or swelling All other systems reviewed and are negative except as documented above in ROS and HPI.  ____________________________________________   PHYSICAL EXAM:  VITAL SIGNS: ED Triage Vitals  Enc Vitals Group     BP 03/23/18 1839 131/80     Pulse Rate 03/23/18 1839 (!) 103     Resp 03/23/18 1839 18     Temp 03/23/18 1839 98.4 F (36.9 C)     Temp Source 03/23/18 1839 Oral     SpO2 03/23/18 1839 100 %     Weight 03/23/18 1840 300 lb (136.1 kg)     Height 03/23/18 1840 5' 11" (1.803 m)     Head Circumference --      Peak Flow --      Pain Score 03/23/18 1840 10       Pain Loc --      Pain Edu? --      Excl. in [place]? --     Vital signs reviewed, nursing assessments reviewed.   Constitutional:   Alert and oriented. Non-toxic appearance. Eyes:   Conjunctivae are normal. EOMI. PERRL.  Photophobia ENT      Head:   Normocephalic and atraumatic.      Nose:   No congestion/rhinnorhea.       Mouth/Throat:   MMM, no pharyngeal erythema. No peritonsillar mass.       Neck:   No meningismus. Full ROM.  No midline tenderness Hematological/Lymphatic/Immunilogical:   No cervical lymphadenopathy. Cardiovascular:   RRR. Symmetric bilateral radial and DP pulses.  No murmurs. Cap refill less than 2 seconds. Respiratory:   Normal respiratory effort without tachypnea/retractions. Breath sounds  are clear and equal bilaterally. No wheezes/rales/rhonchi. Gastrointestinal:   Soft and nontender. Non distended. There is no CVA tenderness.  No rebound, rigidity, or guarding. Musculoskeletal:   Normal range of motion in all extremities. No joint effusions.  No lower extremity tenderness.  No edema. Neurologic:   Normal speech and language.  Motor grossly intact. No acute focal neurologic deficits are appreciated.  Skin:    Skin is warm, dry and intact. No rash noted.  No petechiae, purpura, or bullae.  ____________________________________________    LABS (pertinent positives/negatives) (all labs ordered are listed, but only abnormal results are displayed) Labs Reviewed  BASIC METABOLIC PANEL - Abnormal; Notable for the following components:      Result Value   Chloride 97 (*)    Glucose, Bld 322 (*)    All other components within normal limits  CBC WITH DIFFERENTIAL/PLATELET - Abnormal; Notable for the following components:   WBC 16.2 (*)    MCV 79.9 (*)    RDW 17.1 (*)    Neutro Abs 14.1 (*)    All other components within normal limits   ____________________________________________   EKG    ____________________________________________    RADIOLOGY  No results found.  ____________________________________________   PROCEDURES Procedures  ____________________________________________  DIFFERENTIAL DIAGNOSIS     CLINICAL IMPRESSION / ASSESSMENT AND PLAN / ED COURSE  Pertinent labs & imaging results that were available during my care of the patient were reviewed by me and considered in my medical decision making (see chart for details).      Clinical Course as of Mar 23 2213  Thu Mar 23, 2018  1925 P/w recurrent migraine headache. Will tx with reglan, benadryl, toradol, IVF. Po tylenol. Check labs given h/o DM2 and tachycardia, tachypnea on exam raising possibility of acidosis.    [PS]    Clinical Course User Index [PS] Stafford, Phillip, MD      ----------------------------------------- 8:55 PM on 03/23/2018 -----------------------------------------  Chemistries unremarkable.  There is a leukocytosis of about 16,000 which I think is related to his pain and not indicative of underlying infection.  No evidence of metabolic derangement or acidosis.  We will follow-up on symptom resolution.  Vital signs are normal.   ----------------------------------------- 10:13 PM on 03/23/2018 -----------------------------------------  Symptoms are improving.  Still no meningismus.  No fever.  Tachycardia has resolved with IV fluids.  He is tolerating oral fluid intake.  I doubt meningitis encephalitis intracranial hemorrhage or aneurysm.  I have ordered the patient to repeat dose of Toradol and an IV magnesium infusion of 2 g.  Care will be signed out to covering physician at the end of my shift to follow-up on symptom relief   to determine disposition. ____________________________________________   FINAL CLINICAL IMPRESSION(S) / ED DIAGNOSES    Final diagnoses:  Other migraine with status migrainosus, not intractable     ED Discharge Orders        Ordered    metoCLOPramide (REGLAN) 10 MG tablet  Every 6 hours PRN     03/23/18 2212    famotidine (PEPCID) 20 MG tablet  2 times daily     03/23/18 2212    naproxen (NAPROSYN) 500 MG tablet  2 times daily with meals     03/23/18 2212      Portions of this note were generated with dragon dictation software. Dictation errors may occur despite best attempts at proofreading.    Stafford, Phillip, MD 03/23/18 2214  

## 2018-03-24 ENCOUNTER — Other Ambulatory Visit: Payer: Self-pay

## 2018-03-24 ENCOUNTER — Inpatient Hospital Stay
Admission: EM | Admit: 2018-03-24 | Discharge: 2018-03-24 | DRG: 103 | Discharge status: Against Medical Advice | Payer: Medicaid Other | Attending: Internal Medicine | Admitting: Internal Medicine

## 2018-03-24 ENCOUNTER — Encounter: Payer: Self-pay | Admitting: Emergency Medicine

## 2018-03-24 ENCOUNTER — Emergency Department: Payer: Medicaid Other

## 2018-03-24 DIAGNOSIS — D573 Sickle-cell trait: Secondary | ICD-10-CM | POA: Diagnosis present

## 2018-03-24 DIAGNOSIS — R51 Headache: Secondary | ICD-10-CM | POA: Diagnosis present

## 2018-03-24 DIAGNOSIS — G43901 Migraine, unspecified, not intractable, with status migrainosus: Secondary | ICD-10-CM | POA: Diagnosis present

## 2018-03-24 DIAGNOSIS — E119 Type 2 diabetes mellitus without complications: Secondary | ICD-10-CM | POA: Diagnosis present

## 2018-03-24 DIAGNOSIS — G43909 Migraine, unspecified, not intractable, without status migrainosus: Principal | ICD-10-CM | POA: Diagnosis present

## 2018-03-24 DIAGNOSIS — Z794 Long term (current) use of insulin: Secondary | ICD-10-CM | POA: Diagnosis not present

## 2018-03-24 HISTORY — DX: Sickle-cell disease without crisis: D57.1

## 2018-03-24 LAB — CBC WITH DIFFERENTIAL/PLATELET
BASOS ABS: 0 10*3/uL (ref 0–0.1)
BASOS PCT: 0 %
EOS ABS: 0 10*3/uL (ref 0–0.7)
Eosinophils Relative: 0 %
HEMATOCRIT: 39.4 % — AB (ref 40.0–52.0)
Hemoglobin: 13 g/dL (ref 13.0–18.0)
Lymphocytes Relative: 9 %
Lymphs Abs: 1.5 10*3/uL (ref 1.0–3.6)
MCH: 26.3 pg (ref 26.0–34.0)
MCHC: 33.1 g/dL (ref 32.0–36.0)
MCV: 79.7 fL — ABNORMAL LOW (ref 80.0–100.0)
MONO ABS: 0.8 10*3/uL (ref 0.2–1.0)
MONOS PCT: 5 %
NEUTROS ABS: 15.2 10*3/uL — AB (ref 1.4–6.5)
Neutrophils Relative %: 86 %
PLATELETS: 356 10*3/uL (ref 150–440)
RBC: 4.95 MIL/uL (ref 4.40–5.90)
RDW: 17.3 % — AB (ref 11.5–14.5)
WBC: 17.6 10*3/uL — ABNORMAL HIGH (ref 3.8–10.6)

## 2018-03-24 LAB — BASIC METABOLIC PANEL
Anion gap: 7 (ref 5–15)
BUN: 15 mg/dL (ref 6–20)
CALCIUM: 9.2 mg/dL (ref 8.9–10.3)
CO2: 28 mmol/L (ref 22–32)
CREATININE: 1.08 mg/dL (ref 0.61–1.24)
Chloride: 98 mmol/L (ref 98–111)
Glucose, Bld: 368 mg/dL — ABNORMAL HIGH (ref 70–99)
Potassium: 4.3 mmol/L (ref 3.5–5.1)
SODIUM: 133 mmol/L — AB (ref 135–145)

## 2018-03-24 MED ORDER — PROCHLORPERAZINE EDISYLATE 10 MG/2ML IJ SOLN
10.0000 mg | Freq: Once | INTRAMUSCULAR | Status: AC
  Administered 2018-03-24: 10 mg via INTRAVENOUS
  Filled 2018-03-24: qty 2

## 2018-03-24 MED ORDER — SODIUM CHLORIDE 0.9 % IV BOLUS
1000.0000 mL | Freq: Once | INTRAVENOUS | Status: AC
  Administered 2018-03-24: 1000 mL via INTRAVENOUS

## 2018-03-24 MED ORDER — ONDANSETRON HCL 4 MG/2ML IJ SOLN
4.0000 mg | Freq: Once | INTRAMUSCULAR | Status: AC
  Administered 2018-03-24: 4 mg via INTRAVENOUS
  Filled 2018-03-24: qty 2

## 2018-03-24 MED ORDER — DEXAMETHASONE SODIUM PHOSPHATE 10 MG/ML IJ SOLN
10.0000 mg | Freq: Once | INTRAMUSCULAR | Status: AC
  Administered 2018-03-24: 10 mg via INTRAVENOUS
  Filled 2018-03-24: qty 1

## 2018-03-24 MED ORDER — KETOROLAC TROMETHAMINE 30 MG/ML IJ SOLN
15.0000 mg | Freq: Once | INTRAMUSCULAR | Status: AC
  Administered 2018-03-24: 15 mg via INTRAVENOUS
  Filled 2018-03-24: qty 1

## 2018-03-24 MED ORDER — MAGNESIUM SULFATE 2 GM/50ML IV SOLN
2.0000 g | Freq: Once | INTRAVENOUS | Status: AC
  Administered 2018-03-24: 2 g via INTRAVENOUS
  Filled 2018-03-24: qty 50

## 2018-03-24 MED ORDER — DIPHENHYDRAMINE HCL 50 MG/ML IJ SOLN
25.0000 mg | Freq: Once | INTRAMUSCULAR | Status: AC
  Administered 2018-03-24: 25 mg via INTRAVENOUS
  Filled 2018-03-24: qty 1

## 2018-03-24 MED ORDER — FENTANYL CITRATE (PF) 100 MCG/2ML IJ SOLN
50.0000 ug | Freq: Once | INTRAMUSCULAR | Status: AC
  Administered 2018-03-24: 50 ug via INTRAVENOUS
  Filled 2018-03-24: qty 2

## 2018-03-24 MED ORDER — IOHEXOL 350 MG/ML SOLN
100.0000 mL | Freq: Once | INTRAVENOUS | Status: AC | PRN
  Administered 2018-03-24: 150 mL via INTRAVENOUS

## 2018-03-24 NOTE — ED Notes (Signed)
Patient transported to CT 

## 2018-03-24 NOTE — ED Notes (Signed)
Pt sleeping  Pt continues to have a headache.

## 2018-03-24 NOTE — ED Triage Notes (Signed)
Pt here with c/o severe headache, was seen here last night for the same, pain is behind eyes, has been nauseated, bilateral eye lid swelling noted. States his right ear is congested as well. Crying in triage due to the pain.

## 2018-03-24 NOTE — ED Notes (Signed)
Pt has a headache. Pt was seen in er yesterday with similar sx.  Pt has nausea.  Pt alert.  Speech clear.  Pt reports pain all over his head.   Iv fluids infusing.

## 2018-03-24 NOTE — ED Provider Notes (Signed)
I was called to bedside as the patient insisted on leaving AMA. I talked to the patient at length and carefully explained, in layman's terms, that the standard of care is to be admitted tot he hospital for neurology consultation and serial neuro exams while also trying to alleviate his pain and that by leaving against medical advice they not allowing us to treat their acute medical conditions according to our best medical judgement, and that a serious adverse outcome including increased pain, suffering, short- or long-term disability and even death, could result. I also explained to the patient that we respect their point-of-view and are not angry. I made sure that they understood that they can, and should, return at any time if they change their mind. This conversation was witnessed by Mertie MooresAmy Coyne RN. The patient indicated understanding of our conversation but still desired to leave against medical advice. I deemed him to be of sound mind to make this decision and demonstrate understanding of our conversation.    Willy Eddyobinson, Isao Seltzer, MD 03/24/18 [phone]

## 2018-03-24 NOTE — ED Notes (Signed)
Amy RN, aware of bed assigned  

## 2018-03-24 NOTE — ED Notes (Signed)
Pt alert  Watching tv.  No n/v/  Pt waiting on admission.

## 2018-03-24 NOTE — ED Notes (Signed)
Dr Roxan Hockeyrobinson in with pt now

## 2018-03-24 NOTE — ED Notes (Signed)
Iv fluids infusing   meds given.   

## 2018-03-24 NOTE — ED Provider Notes (Signed)
[facility] Emergency Department Provider Note ____________________________________________   First MD Initiated Contact with Patient 03/24/18 1655     (approximate)  I have reviewed the triage vital signs and the nursing notes.   HISTORY  Chief Complaint Headache  HPI Carlos Hall. is a 42 y.o. male with a history of diabetes as well as sickle cell trait who is presenting to the emergency department today with a persistent headache.  Patient also reports that he is a history of migraine and 2 days ago thought that he was having the onset of his typical migraine.  He says that he came to the hospital where he was given IV medication including Toradol, Reglan and Benadryl.  However, he says that this only relieved his pain temporarily.  He says that he went home and then woke up this morning at about 5 AM with a "20 out of 10 throbbing headache."  He says that the pain is mostly behind his eyes.  He says the light bothers his headache and he also has nausea.  He says the headache now has become diffuse and he is aching throughout his body.  Does not report any fever.  Past Medical History:  Diagnosis Date  . 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 Diabetes mellitus without complication ([place])    type 2  . Sickle cell anemia (HCC)    traits    There are no active problems to display for this patient.   Past Surgical History:  Procedure Laterality Date  . KNEE ARTHROSCOPY    . ROTATOR CUFF REPAIR      Prior to Admission medications   Medication Sig Start Date End Date Taking? Authorizing Provider  blood glucose meter kit and supplies KIT Dispense based on patient and insurance preference. Use up to four times daily as directed. (FOR ICD-9 250.00, 250.01). 08/23/17   Delman Kitten, MD  butalbital-acetaminophen-caffeine (FIORICET, ESGIC) 50-325-40 MG tablet Take 1-2 tablets by mouth every 6 (six) hours as needed for headache. 03/23/18 03/23/19  Earleen Newport,  MD  famotidine (PEPCID) 20 MG tablet Take 1 tablet (20 mg total) by mouth 2 (two) times daily. 03/23/18   Carrie Mew, MD  insulin aspart (NOVOLOG) 100 UNIT/ML injection 30 units twice daily 11/04/17 11/04/18  Gregor Hams, MD  insulin aspart protamine- aspart (NOVOLOG MIX 70/30) (70-30) 100 UNIT/ML injection Inject 30 Units into the skin 2 (two) times daily with a meal.    [provider]  metoCLOPramide (REGLAN) 10 MG tablet Take 1 tablet (10 mg total) by mouth every 6 (six) hours as needed. 03/23/18   Carrie Mew, MD  naproxen (NAPROSYN) 500 MG tablet Take 1 tablet (500 mg total) by mouth 2 (two) times daily with a meal. 03/23/18   Carrie Mew, MD    Allergies Patient has no known allergies.  No family history on file.  Social History Social History   Tobacco Use  . Smoking status: Never Smoker  . Smokeless tobacco: Never Used  Substance Use Topics  . Alcohol use: Yes  . Drug use: Yes    Types: Marijuana    Review of Systems  Constitutional: No fever/chills Eyes: Photophobia ENT: No sore throat. Cardiovascular: Denies chest pain. Respiratory: Denies shortness of breath. Gastrointestinal: No abdominal pain.  no vomiting.  No diarrhea.  No constipation. Genitourinary: Negative for dysuria. Musculoskeletal: Negative for back pain. Skin: Negative for rash. Neurological: Negative for focal weakness or numbness.   ____________________________________________  PHYSICAL EXAM:  VITAL SIGNS: ED Triage Vitals  Enc Vitals Group     BP 03/24/18 1448 (!) 140/110     Pulse Rate 03/24/18 1448 75     Resp 03/24/18 1448 20     Temp 03/24/18 1448 98.1 F (36.7 C)     Temp Source 03/24/18 1448 Oral     SpO2 03/24/18 1448 96 %     Weight 03/24/18 1448 300 lb (136.1 kg)     Height 03/24/18 1448 '5\' 11"'$  (1.803 m)     Head Circumference --      Peak Flow --      Pain Score 03/24/18 1506 10     Pain Loc --      Pain Edu? --      Excl. in [place]? --      Constitutional: Alert and oriented. Well appearing and in no acute distress. Eyes: Conjunctivae are normal.  EOMI.  PERRL.   Head: Atraumatic. Nose: No congestion/rhinnorhea. Mouth/Throat: Mucous membranes are moist.  Neck: No stridor.  No meningismus.  Patient ranges head neck freely. Cardiovascular: Normal rate, regular rhythm. Grossly normal heart sounds.   Respiratory: Normal respiratory effort.  No retractions. Lungs CTAB. Gastrointestinal: Soft and nontender. No distention. No CVA tenderness. Musculoskeletal: No lower extremity tenderness nor edema.  No joint effusions. Neurologic:  Normal speech and language. No gross focal neurologic deficits are appreciated. Skin:  Skin is warm, dry and intact. No rash noted. Psychiatric: Mood and affect are normal. Speech and behavior are normal.  ____________________________________________   LABS (all labs ordered are listed, but only abnormal results are displayed)  Labs Reviewed  CBC WITH DIFFERENTIAL/PLATELET - Abnormal; Notable for the following components:      Result Value   WBC 17.6 (*)    HCT 39.4 (*)    MCV 79.7 (*)    RDW 17.3 (*)    Neutro Abs 15.2 (*)    All other components within normal limits  BASIC METABOLIC PANEL - Abnormal; Notable for the following components:   Sodium 133 (*)    Glucose, Bld 368 (*)    All other components within normal limits   ____________________________________________  EKG   ____________________________________________  RADIOLOGY  CT angiography without any acute process.  Possibly early atherosclerotic change. ____________________________________________   PROCEDURES  Procedure(s) performed:   Procedures  Critical Care performed:   ____________________________________________   INITIAL IMPRESSION / ASSESSMENT AND PLAN / ED COURSE  Pertinent labs & imaging results that were available during my care of the patient were reviewed by me and considered in my medical  decision making (see chart for details).  Differential diagnosis includes, but is not limited to, intracranial hemorrhage, meningitis/encephalitis, previous head trauma, cavernous venous thrombosis, tension headache, temporal arteritis, migraine or migraine equivalent, idiopathic intracranial hypertension, and non-specific headache. As part of my medical decision making, I reviewed the following data within the electronic MEDICAL RECORD NUMBER Notes from prior ED visits  White blood cell count 17 today.  Was 96 yesterday.  Appears to have a chronically elevated white blood cell count.  ----------------------------------------- 7:32 PM on 03/24/2018 -----------------------------------------  Patient with only slight relief after IV medications.  Again palpated the patient's neck and he says it feels good when I palpate his neck.  Able to range his head neck freely without meningismus.  Likely status migrainosus.  Patient to be admitted for further work-up and treatment.  Signed out to Dr. Brett Albino.  Patient aware of the diagnosis as well as  treatment plan. ____________________________________________   FINAL CLINICAL IMPRESSION(S) / ED DIAGNOSES  Status migrainosus.  NEW MEDICATIONS STARTED DURING THIS VISIT:  New Prescriptions   No medications on file     Note:  This document was prepared using Dragon voice recognition software and may include unintentional dictation errors.     Orbie Pyo, MD 03/24/18 [phone]

## 2018-03-24 NOTE — Discharge Instructions (Signed)

## 2018-03-26 ENCOUNTER — Encounter: Payer: Self-pay | Admitting: Internal Medicine

## 2018-03-26 ENCOUNTER — Inpatient Hospital Stay
Admission: EM | Admit: 2018-03-26 | Discharge: 2018-03-27 | DRG: 103 | Disposition: A | Discharge status: Home / Self Care | Payer: Medicaid Other | Attending: Internal Medicine | Admitting: Internal Medicine

## 2018-03-26 ENCOUNTER — Other Ambulatory Visit: Payer: Self-pay

## 2018-03-26 DIAGNOSIS — D571 Sickle-cell disease without crisis: Secondary | ICD-10-CM | POA: Diagnosis present

## 2018-03-26 DIAGNOSIS — Z8249 Family history of ischemic heart disease and other diseases of the circulatory system: Secondary | ICD-10-CM

## 2018-03-26 DIAGNOSIS — R519 Headache, unspecified: Secondary | ICD-10-CM

## 2018-03-26 DIAGNOSIS — G43909 Migraine, unspecified, not intractable, without status migrainosus: Secondary | ICD-10-CM | POA: Diagnosis present

## 2018-03-26 DIAGNOSIS — R51 Headache: Secondary | ICD-10-CM | POA: Diagnosis present

## 2018-03-26 DIAGNOSIS — G43901 Migraine, unspecified, not intractable, with status migrainosus: Principal | ICD-10-CM | POA: Diagnosis present

## 2018-03-26 DIAGNOSIS — E119 Type 2 diabetes mellitus without complications: Secondary | ICD-10-CM | POA: Diagnosis present

## 2018-03-26 DIAGNOSIS — Z8041 Family history of malignant neoplasm of ovary: Secondary | ICD-10-CM

## 2018-03-26 DIAGNOSIS — Z794 Long term (current) use of insulin: Secondary | ICD-10-CM | POA: Diagnosis not present

## 2018-03-26 LAB — CBC WITH DIFFERENTIAL/PLATELET
BASOS ABS: 0.1 10*3/uL (ref 0–0.1)
Basophils Relative: 1 %
Eosinophils Absolute: 0 10*3/uL (ref 0–0.7)
Eosinophils Relative: 0 %
HEMATOCRIT: 37.6 % — AB (ref 40.0–52.0)
Hemoglobin: 12.4 g/dL — ABNORMAL LOW (ref 13.0–18.0)
LYMPHS PCT: 12 %
Lymphs Abs: 1.6 10*3/uL (ref 1.0–3.6)
MCH: 26 pg (ref 26.0–34.0)
MCHC: 33 g/dL (ref 32.0–36.0)
MCV: 78.8 fL — AB (ref 80.0–100.0)
MONO ABS: 0.8 10*3/uL (ref 0.2–1.0)
Monocytes Relative: 6 %
NEUTROS ABS: 10.5 10*3/uL — AB (ref 1.4–6.5)
Neutrophils Relative %: 81 %
Platelets: 337 10*3/uL (ref 150–440)
RBC: 4.77 MIL/uL (ref 4.40–5.90)
RDW: 17 % — AB (ref 11.5–14.5)
WBC: 13 10*3/uL — ABNORMAL HIGH (ref 3.8–10.6)

## 2018-03-26 LAB — TROPONIN I: Troponin I: <0.03 ng/mL (ref <0.03)

## 2018-03-26 LAB — BASIC METABOLIC PANEL
ANION GAP: 7 (ref 5–15)
BUN: 13 mg/dL (ref 6–20)
CALCIUM: 8.7 mg/dL — AB (ref 8.9–10.3)
CHLORIDE: 99 mmol/L (ref 98–111)
CO2: 27 mmol/L (ref 22–32)
Creatinine, Ser: 0.95 mg/dL (ref 0.61–1.24)
GFR calc Af Amer: >60 mL/min (ref >60)
GFR calc non Af Amer: >60 mL/min (ref >60)
GLUCOSE: 288 mg/dL — AB (ref 70–99)
Potassium: 3.8 mmol/L (ref 3.5–5.1)
Sodium: 133 mmol/L — ABNORMAL LOW (ref 135–145)

## 2018-03-26 LAB — GLUCOSE, CAPILLARY: GLUCOSE-CAPILLARY: 220 mg/dL — AB (ref 70–99)

## 2018-03-26 MED ORDER — INSULIN ASPART PROT & ASPART (70-30 MIX) 100 UNIT/ML ~~LOC~~ SUSP: 25.0000 [IU] | Freq: Two times a day (BID) | SUBCUTANEOUS | Status: DC

## 2018-03-26 MED ORDER — HEPARIN SODIUM (PORCINE) 5000 UNIT/ML IJ SOLN
5000.0000 [IU] | Freq: Three times a day (TID) | INTRAMUSCULAR | Status: DC
  Administered 2018-03-26 – 2018-03-27 (×2): 5000 [IU] via SUBCUTANEOUS
  Filled 2018-03-26 (×3): qty 1

## 2018-03-26 MED ORDER — SODIUM CHLORIDE 0.9 % IV BOLUS
1000.0000 mL | Freq: Once | INTRAVENOUS | Status: AC
  Administered 2018-03-26: 1000 mL via INTRAVENOUS

## 2018-03-26 MED ORDER — PNEUMOCOCCAL VAC POLYVALENT 25 MCG/0.5ML IJ INJ: 0.5000 mL | INJECTION | INTRAMUSCULAR | Status: DC

## 2018-03-26 MED ORDER — MAGNESIUM SULFATE 2 GM/50ML IV SOLN
2.0000 g | Freq: Once | INTRAVENOUS | Status: AC
Start: 2018-03-26 — End: 2018-03-26
  Administered 2018-03-26: 2 g via INTRAVENOUS
  Filled 2018-03-26: qty 50

## 2018-03-26 MED ORDER — BUTALBITAL-APAP-CAFFEINE 50-325-40 MG PO TABS
1.0000 | ORAL_TABLET | Freq: Four times a day (QID) | ORAL | Status: DC | PRN
  Administered 2018-03-27 (×3): 1 via ORAL
  Filled 2018-03-26 (×3): qty 1

## 2018-03-26 MED ORDER — SUMATRIPTAN SUCCINATE 50 MG PO TABS
50.0000 mg | ORAL_TABLET | Freq: Once | ORAL | Status: AC
  Administered 2018-03-26: 50 mg via ORAL
  Filled 2018-03-26: qty 1

## 2018-03-26 MED ORDER — INSULIN ASPART PROT & ASPART (70-30 MIX) 100 UNIT/ML ~~LOC~~ SUSP
25.0000 [IU] | Freq: Two times a day (BID) | SUBCUTANEOUS | Status: DC
  Administered 2018-03-26 – 2018-03-27 (×3): 25 [IU] via SUBCUTANEOUS
  Filled 2018-03-26 (×3): qty 10

## 2018-03-26 MED ORDER — DOCUSATE SODIUM 100 MG PO CAPS: 100.0000 mg | ORAL_CAPSULE | Freq: Two times a day (BID) | ORAL | Status: DC | PRN

## 2018-03-26 MED ORDER — INSULIN ASPART 100 UNIT/ML ~~LOC~~ SOLN
0.0000 [IU] | Freq: Three times a day (TID) | SUBCUTANEOUS | Status: DC
  Administered 2018-03-27 (×3): 1 [IU] via SUBCUTANEOUS
  Filled 2018-03-26 (×3): qty 1

## 2018-03-26 MED ORDER — DIAZEPAM 5 MG/ML IJ SOLN
5.0000 mg | Freq: Once | INTRAMUSCULAR | Status: AC
Start: 2018-03-26 — End: 2018-03-26
  Administered 2018-03-26: 5 mg via INTRAVENOUS

## 2018-03-26 NOTE — ED Triage Notes (Signed)
First Nurse note:  C/O headache all day.  Seen through ED Thursday for same complaint.  Has history of Migraines.  VS wnl:  127/81  P:  64  Spo2:  100%.  CBG:  300.  Patietn AAOx3.  Skin warm and dry. NAD

## 2018-03-26 NOTE — H&P (Signed)
[facility] NAME: Carlos Hall    MR#:  [id number]  DATE OF BIRTH:  1976-05-17  DATE OF ADMISSION:  03/26/2018  PRIMARY CARE PHYSICIAN: [facility] [place]   REQUESTING/REFERRING PHYSICIAN: [place]  CHIEF COMPLAINT:   Chief Complaint  Patient presents with  . Migraine    HISTORY OF PRESENT ILLNESS: Carlos Hall  is a 42 y.o. male with a known history of diabetes, sickle cell anemia trait-came to emergency room 2 days ago with significant headache and migraine.  Plan was to admit to hospital but after getting some injection patient felt little bit better and he decided to leave [place] as he had some " important things to take care of" He had good sleep after that day, but last night he could not sleep again because of worsening significant headache and that continued to get worse so came to emergency room today again. He denies any associated vision changes, focal symptoms, numbness. CT scan angiogram of the head was done 2 days ago was negative for any significant findings.  Patient denies any new injuries. This time he agreed to stay in the hospital so hospitalist service was called in for admission.  PAST MEDICAL HISTORY:   Past Medical History:  Diagnosis Date  . 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 Diabetes mellitus without complication ([place])    type 2  . Sickle cell anemia (HCC)    traits    PAST SURGICAL HISTORY:  Past Surgical History:  Procedure Laterality Date  . KNEE ARTHROSCOPY    . ROTATOR CUFF REPAIR      SOCIAL HISTORY:  Social History   Tobacco Use  . Smoking status: Never Smoker  . Smokeless tobacco: Never Used  Substance Use Topics  . Alcohol use: Yes    FAMILY HISTORY:  Family History  Problem Relation Age of Onset  . Ovarian cancer Mother   . CAD Father     DRUG ALLERGIES: No Known Allergies  REVIEW OF SYSTEMS:   CONSTITUTIONAL: No fever, fatigue or  weakness.  EYES: No blurred or double vision.  EARS, NOSE, AND THROAT: No tinnitus or ear pain.  RESPIRATORY: No cough, shortness of breath, wheezing or hemoptysis.  CARDIOVASCULAR: No chest pain, orthopnea, edema.  GASTROINTESTINAL: No nausea, vomiting, diarrhea or abdominal pain.  GENITOURINARY: No dysuria, hematuria.  ENDOCRINE: No polyuria, nocturia,  HEMATOLOGY: No anemia, easy bruising or bleeding SKIN: No rash or lesion. MUSCULOSKELETAL: No joint pain or arthritis.   NEUROLOGIC: No tingling, numbness, weakness.  PSYCHIATRY: No anxiety or depression.   MEDICATIONS AT HOME:  Prior to Admission medications   Medication Sig Start Date End Date Taking? Authorizing Provider  amoxicillin (AMOXIL) 500 MG capsule Take 500 mg by mouth every 8 (eight) hours. 03/06/18  Yes [provider]  butalbital-acetaminophen-caffeine (FIORICET, ESGIC) 50-325-40 MG tablet Take 1-2 tablets by mouth every 6 (six) hours as needed for headache. 03/23/18 03/23/19 Yes Earleen Newport, MD  famotidine (PEPCID) 20 MG tablet Take 1 tablet (20 mg total) by mouth 2 (two) times daily. 03/23/18  Yes Carrie Mew, MD  insulin NPH-regular Human (NOVOLIN 70/30) (70-30) 100 UNIT/ML injection Inject 30 Units into the skin 2 (two) times daily with a meal.   Yes [provider]  metoCLOPramide (REGLAN) 10 MG tablet Take 1 tablet (10 mg total) by mouth every 6 (six) hours as needed. 03/23/18  Yes Carrie Mew, MD  naproxen (NAPROSYN) 500 MG tablet  Take 1 tablet (500 mg total) by mouth 2 (two) times daily with a meal. 03/23/18  Yes Carrie Mew, MD  blood glucose meter kit and supplies KIT Dispense based on patient and insurance preference. Use up to four times daily as directed. (FOR ICD-9 250.00, 250.01). 08/23/17   Delman Kitten, MD      PHYSICAL EXAMINATION:   VITAL SIGNS: Blood pressure 130/90, pulse (!) 56, temperature 98.5 F (36.9 C), temperature source Oral, resp. rate 16, height 5' 11" (1.803  m), weight 136.1 kg (300 lb), SpO2 100 %.  GENERAL:  42 y.o.-year-old patient lying in the bed with no acute distress.  EYES: Pupils equal, round, reactive to light and accommodation. No scleral icterus. Extraocular muscles intact.  HEENT: Head atraumatic, normocephalic. Oropharynx and nasopharynx clear.  NECK:  Supple, no jugular venous distention. No thyroid enlargement, no tenderness.  LUNGS: Normal breath sounds bilaterally, no wheezing, rales,rhonchi or crepitation. No use of accessory muscles of respiration.  CARDIOVASCULAR: S1, S2 normal. No murmurs, rubs, or gallops.  ABDOMEN: Soft, nontender, nondistended. Bowel sounds present. No organomegaly or mass.  EXTREMITIES: No pedal edema, cyanosis, or clubbing.  NEUROLOGIC: Cranial nerves II through XII are intact. Muscle strength 5/5 in all extremities. Sensation intact. Gait not checked.  PSYCHIATRIC: The patient is alert and oriented x 3.  SKIN: No obvious rash, lesion, or ulcer.   LABORATORY PANEL:   CBC Recent Labs  Lab 03/23/18 1942 03/24/18 1536 03/26/18 1701  WBC 16.2* 17.6* 13.0*  HGB 13.4 13.0 12.4*  HCT 40.7 39.4* 37.6*  PLT 309 356 337  MCV 79.9* 79.7* 78.8*  MCH 26.4 26.3 26.0  MCHC 33.0 33.1 33.0  RDW 17.1* 17.3* 17.0*  LYMPHSABS 1.2 1.5 1.6  MONOABS 0.8 0.8 0.8  EOSABS 0.0 0.0 0.0  BASOSABS 0.0 0.0 0.1   ------------------------------------------------------------------------------------------------------------------  Chemistries  Recent Labs  Lab 03/23/18 1942 03/24/18 1536 03/26/18 1701  NA 136 133* 133*  K 4.2 4.3 3.8  CL 97* 98 99  CO2 _0 GLUCOSE 322* 368* 288*  BUN _1 CREATININE 1.02 1.08 0.95  CALCIUM 9.3 9.2 8.7*   ------------------------------------------------------------------------------------------------------------------ estimated creatinine clearance is 142.7 mL/min (by C-G formula based on SCr of 0.95  mg/dL). ------------------------------------------------------------------------------------------------------------------ No results for input(s): TSH, T4TOTAL, T3FREE, THYROIDAB in the last 72 hours.  Invalid input(s): FREET3   Coagulation profile No results for input(s): INR, PROTIME in the last 168 hours. ------------------------------------------------------------------------------------------------------------------- No results for input(s): DDIMER in the last 72 hours. -------------------------------------------------------------------------------------------------------------------  Cardiac Enzymes Recent Labs  Lab 03/26/18 1701  TROPONINI <0.03   ------------------------------------------------------------------------------------------------------------------ Invalid input(s): POCBNP  ---------------------------------------------------------------------------------------------------------------  Urinalysis    Component Value Date/Time   COLORURINE COLORLESS (A) 11/03/2017 2233   APPEARANCEUR CLEAR (A) 11/03/2017 2233   APPEARANCEUR Clear 12/01/2013 1514   LABSPEC 1.022 11/03/2017 2233   LABSPEC 1.020 12/01/2013 1514   PHURINE 6.0 11/03/2017 2233   GLUCOSEU >=500 (A) 11/03/2017 2233   GLUCOSEU >=500 12/01/2013 1514   HGBUR NEGATIVE 11/03/2017 2233   BILIRUBINUR NEGATIVE 11/03/2017 2233   BILIRUBINUR Negative 12/01/2013 [place] 11/03/2017 2233   PROTEINUR NEGATIVE 11/03/2017 2233   NITRITE NEGATIVE 11/03/2017 2233   LEUKOCYTESUR NEGATIVE 11/03/2017 2233   LEUKOCYTESUR Negative 12/01/2013 1514     RADIOLOGY: No results found.  EKG: Orders placed or performed during the hospital encounter of 03/26/18  . ED EKG  . ED EKG  . EKG 12-Lead  . EKG 12-Lead  . EKG 12-Lead  .  EKG 12-Lead    IMPRESSION AND PLAN:  *Migraine headache We will give Imitrex and Fioricet, injection Valium was given by ER. If still does not help we will give her  injection Benadryl to help induce sleep for tonight. Recent CT scan done 2 days ago so I will not repeat any radiological studies at this time. Neurology consult for further management.  *Diabetes We will continue insulin 70/30 but slightly decreased dose and keep on sliding scale coverage with insulin.  All the records are reviewed and case discussed with ED provider. Management plans discussed with the patient, family and they are in agreement.  CODE STATUS: Full.  TOTAL TIME TAKING CARE OF THIS PATIENT: 45 minutes.    Vaughan Basta M.D on 03/26/2018   Between 7am to 6pm - Pager - [phone]  After 6pm go to [email or web address] - password [email or web address] [facility]  Office  [phone]  CC: Primary care physician; [facility]   Note: This dictation was prepared with Dragon dictation along with smaller phrase technology. Any transcriptional errors that result from this process are unintentional.

## 2018-03-26 NOTE — ED Provider Notes (Signed)
[facility] Emergency Department Provider Note  ____________________________________________   First MD Initiated Contact with Patient 03/26/18 1644     (approximate)  I have reviewed the triage vital signs and the nursing notes.   HISTORY  Chief Complaint Migraine  HPI Carlos Hall. is a 42 y.o. male with a history of diabetes as well as migraine headaches who is presenting to the emergency department today for recurrent migraine.  He left AGAINST MEDICAL ADVICE this past Friday night and says that he rested all day yesterday and felt improved until he left church today.  He says that he then started having pressure to the front of his head over the forehead radiating through to the occiput.  He says that it feels like a pressure and a stiffness that is an 8 out of 10 at this time.  Light worsens the symptoms and he feels nauseous as well.  He says that he also felt like he was going to pass out earlier today but a neighbor caught him.  He denies any chest pain or shortness of breath.  Denies any fever.  Reports having dental work done 2 weeks ago but no dental pain at this time.  Past Medical History:  Diagnosis Date  . 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 Diabetes mellitus without complication ([place])    type 2  . Sickle cell anemia (HCC)    traits    Patient Active Problem List   Diagnosis Date Noted  . 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 KNEE ARTHROSCOPY    . ROTATOR CUFF REPAIR      Prior to Admission medications   Medication Sig Start Date End Date Taking? Authorizing Provider  blood glucose meter kit and supplies KIT Dispense based on patient and insurance preference. Use up to four times daily as directed. (FOR ICD-9 250.00, 250.01). 08/23/17   Delman Kitten, MD  butalbital-acetaminophen-caffeine (FIORICET, ESGIC) 50-325-40 MG tablet Take 1-2 tablets by mouth every 6 (six) hours as needed for  headache. 03/23/18 03/23/19  Earleen Newport, MD  famotidine (PEPCID) 20 MG tablet Take 1 tablet (20 mg total) by mouth 2 (two) times daily. 03/23/18   Carrie Mew, MD  insulin NPH-regular Human (NOVOLIN 70/30) (70-30) 100 UNIT/ML injection Inject 30 Units into the skin 2 (two) times daily with a meal.    [provider]  metoCLOPramide (REGLAN) 10 MG tablet Take 1 tablet (10 mg total) by mouth every 6 (six) hours as needed. 03/23/18   Carrie Mew, MD  naproxen (NAPROSYN) 500 MG tablet Take 1 tablet (500 mg total) by mouth 2 (two) times daily with a meal. 03/23/18   Carrie Mew, MD    Allergies Patient has no known allergies.  No family history on file.  Social History Social History   Tobacco Use  . Smoking status: Never Smoker  . Smokeless tobacco: Never Used  Substance Use Topics  . Alcohol use: Yes  . Drug use: Yes    Types: Marijuana    Review of Systems  Constitutional: No fever/chills Eyes: No visual changes. ENT: No sore throat. Cardiovascular: Denies chest pain. Respiratory: Denies shortness of breath. Gastrointestinal: No abdominal pain.  No nausea, no vomiting.  No diarrhea.  No constipation. Genitourinary: Negative for dysuria. Musculoskeletal: Negative for back pain. Skin: Negative for rash. Neurological: Negative for focal weakness or numbness.   ____________________________________________   PHYSICAL EXAM:  VITAL SIGNS: ED  Triage Vitals  Enc Vitals Group     BP 03/26/18 1512 (!) 126/92     Pulse Rate 03/26/18 1512 (!) 55     Resp 03/26/18 1512 16     Temp 03/26/18 1512 98.5 F (36.9 C)     Temp Source 03/26/18 1512 Oral     SpO2 03/26/18 1512 99 %     Weight 03/26/18 1513 300 lb (136.1 kg)     Height 03/26/18 1513 '5\' 11"'$  (1.803 m)     Head Circumference --      Peak Flow --      Pain Score 03/26/18 1512 8     Pain Loc --      Pain Edu? --      Excl. in [place]? --     Constitutional: Alert and oriented. Well appearing and  in no acute distress. Eyes: Conjunctivae are normal.  Head: Atraumatic. Nose: No congestion/rhinnorhea. Mouth/Throat: Mucous membranes are moist.  Neck: No stridor.  No meningismus.  Patient ranges head neck freely. Cardiovascular: Normal rate, regular rhythm. Grossly normal heart sounds.   Respiratory: Normal respiratory effort.  No retractions. Lungs CTAB. Gastrointestinal: Soft and nontender. No distention. No CVA tenderness. Musculoskeletal: No lower extremity tenderness nor edema.  No joint effusions. Neurologic:  Normal speech and language. No gross focal neurologic deficits are appreciated. Skin:  Skin is warm, dry and intact. No rash noted. Psychiatric: Mood and affect are normal. Speech and behavior are normal.  ____________________________________________   LABS (all labs ordered are listed, but only abnormal results are displayed)  Labs Reviewed  CBC WITH DIFFERENTIAL/PLATELET - Abnormal; Notable for the following components:      Result Value   WBC 13.0 (*)    Hemoglobin 12.4 (*)    HCT 37.6 (*)    MCV 78.8 (*)    RDW 17.0 (*)    Neutro Abs 10.5 (*)    All other components within normal limits  BASIC METABOLIC PANEL - Abnormal; Notable for the following components:   Sodium 133 (*)    Glucose, Bld 288 (*)    Calcium 8.7 (*)    All other components within normal limits  TROPONIN I   ____________________________________________  EKG  ED ECG REPORT I, Doran Stabler, the attending physician, personally viewed and interpreted this ECG.   Date: 03/26/2018  EKG Time: 1706  Rate: 55  Rhythm: Sinus arrhythmia  Axis: Normal  Intervals:none  ST&T Change: No ST segment elevation or depression.  No abnormal T wave inversion.  ____________________________________________  RADIOLOGY  Discussed the CT angiography from several days ago with Dr. Vonzella Nipple who states that on delayed imaging he does not see any evidence of venous  thrombosis. ____________________________________________   PROCEDURES  Procedure(s) performed:   Procedures  Critical Care performed:   ____________________________________________   INITIAL IMPRESSION / ASSESSMENT AND PLAN / ED COURSE  Pertinent labs & imaging results that were available during my care of the patient were reviewed by me and considered in my medical decision making (see chart for details).  Differential diagnosis includes, but is not limited to, intracranial hemorrhage, meningitis/encephalitis, previous head trauma, cavernous venous thrombosis, tension headache, temporal arteritis, migraine or migraine equivalent, idiopathic intracranial hypertension, and non-specific headache. As part of my medical decision making, I reviewed the following data within the electronic MEDICAL RECORD NUMBER Notes from prior ED visits  ----------------------------------------- 6:14 PM on 03/26/2018 -----------------------------------------  I discussed the case with Dr. Irish Elders of neurology.  My differential for this patient  includes status migrainosus, pseudotumor cerebri, viral meningitis.  However, I am unable to landmark any vertebrae secondary to the patient's body habitus.  I did consent him for a lumbar puncture but I feel like a completely blind lumbar puncture would be quite risky in this patient.  He was given Valium and fluids and says that his headache is slightly improved but is still present.  Improved white blood cell count.  Dr. Irish Elders recommends admission at this time.  I asked him about further treatment at this time as he is to hold as the presentation seems nonspecific.  Patient understands need for admission to the hospital.  Signed out to Dr. Anselm Jungling. ____________________________________________   FINAL CLINICAL IMPRESSION(S) / ED DIAGNOSES  Intractable headache.  NEW MEDICATIONS STARTED DURING THIS VISIT:  New Prescriptions   No medications on file      Note:  This document was prepared using Dragon voice recognition software and may include unintentional dictation errors.     Orbie Pyo, MD 03/26/18 [phone]

## 2018-03-26 NOTE — ED Notes (Signed)
Ann RN, aware of bed assigned 

## 2018-03-26 NOTE — ED Notes (Signed)
Pt is AOx4, vss, he is in bed with rails upx2, on the monitor. Pt c/o migraine headache at 10/10. He denies dizziness, chest pain, SOB or N/V/D. We will continue to the monitor the pt.

## 2018-03-26 NOTE — ED Triage Notes (Signed)
Pt in via [facility] from home with complaints of migraine since Wednesday w/ hx of same.  Pt reports associated dizziness, nausea, photophobia.  NAD noted at this time.

## 2018-03-27 DIAGNOSIS — G43901 Migraine, unspecified, not intractable, with status migrainosus: Principal | ICD-10-CM

## 2018-03-27 LAB — CBC
HEMATOCRIT: 35.9 % — AB (ref 40.0–52.0)
Hemoglobin: 11.8 g/dL — ABNORMAL LOW (ref 13.0–18.0)
MCH: 26 pg (ref 26.0–34.0)
MCHC: 33 g/dL (ref 32.0–36.0)
MCV: 78.8 fL — AB (ref 80.0–100.0)
Platelets: 299 10*3/uL (ref 150–440)
RBC: 4.55 MIL/uL (ref 4.40–5.90)
RDW: 17.2 % — ABNORMAL HIGH (ref 11.5–14.5)
WBC: 9.7 10*3/uL (ref 3.8–10.6)

## 2018-03-27 LAB — BASIC METABOLIC PANEL
Anion gap: 7 (ref 5–15)
BUN: 11 mg/dL (ref 6–20)
CHLORIDE: 103 mmol/L (ref 98–111)
CO2: 27 mmol/L (ref 22–32)
Calcium: 8.5 mg/dL — ABNORMAL LOW (ref 8.9–10.3)
Creatinine, Ser: 0.83 mg/dL (ref 0.61–1.24)
GFR calc Af Amer: >60 mL/min (ref >60)
GFR calc non Af Amer: >60 mL/min (ref >60)
GLUCOSE: 160 mg/dL — AB (ref 70–99)
POTASSIUM: 3.6 mmol/L (ref 3.5–5.1)
Sodium: 137 mmol/L (ref 135–145)

## 2018-03-27 LAB — GLUCOSE, CAPILLARY
GLUCOSE-CAPILLARY: 147 mg/dL — AB (ref 70–99)
GLUCOSE-CAPILLARY: 135 mg/dL — AB (ref 70–99)
Glucose-Capillary: 121 mg/dL — ABNORMAL HIGH (ref 70–99)

## 2018-03-27 MED ORDER — DOCUSATE SODIUM 100 MG PO CAPS: 100.0000 mg | ORAL_CAPSULE | Freq: Two times a day (BID) | ORAL | 0 refills | Status: DC | PRN

## 2018-03-27 MED ORDER — BUTALBITAL-APAP-CAFFEINE 50-325-40 MG PO TABS: 1.0000 | ORAL_TABLET | Freq: Four times a day (QID) | ORAL | 0 refills | Status: AC | PRN

## 2018-03-27 MED ORDER — INSULIN NPH ISOPHANE & REGULAR (70-30) 100 UNIT/ML ~~LOC~~ SUSP: 30.0000 [IU] | Freq: Two times a day (BID) | SUBCUTANEOUS | 1 refills | Status: AC

## 2018-03-27 NOTE — Progress Notes (Signed)
Discharge instructions reviewed new medications and followup visits.  Understanding was verbalized and all questions were answered.  IV removed without complication.  Patient discharged home in stable condition.

## 2018-03-27 NOTE — Discharge Summary (Signed)
[facility] [place] [facility] NAME: Carlos Hall    MR#:  [id number]  DATE OF BIRTH:  1976/04/30  DATE OF ADMISSION:  03/26/2018 ADMITTING PHYSICIAN: Vaughan Basta, MD  DATE OF DISCHARGE:  03/27/18 PRIMARY CARE PHYSICIAN: [facility] [place]    ADMISSION DIAGNOSIS:  Intractable headache, unspecified chronicity pattern, unspecified headache type [R51]  DISCHARGE DIAGNOSIS:  Principal Problem:   Status migrainosus Active Problems:   Migraine headache   SECONDARY DIAGNOSIS:   Past Medical History:  Diagnosis Date  . 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 Diabetes mellitus without complication ([place])    type 2  . Sickle cell anemia (HCC)    traits    HOSPITAL COURSE:   HISTORY OF PRESENT ILLNESS: Carlos Hall  is a 42 y.o. male with a known history of diabetes, sickle cell anemia trait-came to emergency room 2 days ago with significant headache and migraine.  Plan was to admit to hospital but after getting some injection patient felt little bit better and he decided to leave [place] as he had some " important things to take care of" He had good sleep after that day, but last night he could not sleep again because of worsening significant headache and that continued to get worse so came to emergency room today again. He denies any associated vision changes, focal symptoms, numbness. CT scan angiogram of the head was done 2 days ago was negative for any significant findings.  Patient denies any new injuries. This time he agreed to stay in the hospital so hospitalist service was called in for admission.  *Migraine headache Clinically improved We will continue Fioricet prn Recent CT scan done 2 days ago so I will not repeat any radiological studies at this time. Outpatient neurology follow-up  *Diabetes-insulin requiring We will continue insulin 70/30 but slightly decreased dose and keep on sliding  scale coverage with insulin. Outpatient follow-up with primary care physician  *Blurry vision could be from diabetes-chronic in nature Outpatient follow-up with ophthalmology    DISCHARGE CONDITIONS:   fair  CONSULTS OBTAINED:  Treatment Team:  Alexis Goodell, MD   PROCEDURES  None   DRUG ALLERGIES:  No Known Allergies  DISCHARGE MEDICATIONS:   Allergies as of 03/27/2018   No Known Allergies     Medication List    STOP taking these medications   amoxicillin 500 MG capsule Commonly known as:  AMOXIL   naproxen 500 MG tablet Commonly known as:  NAPROSYN     TAKE these medications   blood glucose meter kit and supplies Kit Dispense based on patient and insurance preference. Use up to four times daily as directed. (FOR ICD-9 250.00, 250.01).   butalbital-acetaminophen-caffeine 50-325-40 MG tablet Commonly known as:  FIORICET, ESGIC Take 1-2 tablets by mouth every 6 (six) hours as needed for headache.   docusate sodium 100 MG capsule Commonly known as:  COLACE Take 1 capsule (100 mg total) by mouth 2 (two) times daily as needed for mild constipation.   famotidine 20 MG tablet Commonly known as:  PEPCID Take 1 tablet (20 mg total) by mouth 2 (two) times daily.   insulin NPH-regular Human (70-30) 100 UNIT/ML injection Commonly known as:  NOVOLIN 70/30 Inject 30 Units into the skin 2 (two) times daily with a meal.   metoCLOPramide 10 MG tablet Commonly known as:  REGLAN Take 1 tablet (10 mg total) by mouth every 6 (six) hours as needed.  DISCHARGE INSTRUCTIONS:   Follow-up with primary care physician in 2 to 3 days Follow-up with neurology Dr. Manuella Ghazi in a week Follow-up with ophthalmology on August 8 at 8:35 AM  DIET:  Diabetic diet  DISCHARGE CONDITION:  Fair  ACTIVITY:  Activity as tolerated  OXYGEN:  Home Oxygen: No.   Oxygen Delivery: room air  DISCHARGE LOCATION:  home   If you experience worsening of your admission symptoms,  develop shortness of breath, life threatening emergency, suicidal or homicidal thoughts you must seek medical attention immediately by calling 911 or calling your MD immediately  if symptoms less severe.  You Must read complete instructions/literature along with all the possible adverse reactions/side effects for all the Medicines you take and that have been prescribed to you. Take any new Medicines after you have completely understood and accpet all the possible adverse reactions/side effects.   Please note  You were cared for by a hospitalist during your hospital stay. If you have any questions about your discharge medications or the care you received while you were in the hospital after you are discharged, you can call the unit and asked to speak with the hospitalist on call if the hospitalist that took care of you is not available. Once you are discharged, your primary care physician will handle any further medical issues. Please note that NO REFILLS for any discharge medications will be authorized once you are discharged, as it is imperative that you return to your primary care physician (or establish a relationship with a primary care physician if you do not have one) for your aftercare needs so that they can reassess your need for medications and monitor your lab values.     Today  Chief Complaint  Patient presents with  . Migraine   Patient's headache is completely resolved and feeling better.  Wants to go home.  Reporting blurry vision which has been chronic but not seen by eye doctor  ROS:  CONSTITUTIONAL: Denies fevers, chills. Denies any fatigue, weakness.  EYES: Denies blurry vision, double vision, eye pain. EARS, NOSE, THROAT: Denies tinnitus, ear pain, hearing loss. RESPIRATORY: Denies cough, wheeze, shortness of breath.  CARDIOVASCULAR: Denies chest pain, palpitations, edema.  GASTROINTESTINAL: Denies nausea, vomiting, diarrhea, abdominal pain. Denies bright red blood per  rectum. GENITOURINARY: Denies dysuria, hematuria. ENDOCRINE: Denies nocturia or thyroid problems. HEMATOLOGIC AND LYMPHATIC: Denies easy bruising or bleeding. SKIN: Denies rash or lesion. MUSCULOSKELETAL: Denies pain in neck, back, shoulder, knees, hips or arthritic symptoms.  NEUROLOGIC: Denies paralysis, paresthesias.  PSYCHIATRIC: Denies anxiety or depressive symptoms.   VITAL SIGNS:  Blood pressure 139/82, pulse 63, temperature 98.1 F (36.7 C), temperature source Oral, resp. rate 18, height '5\' 11"'$  (1.803 m), weight (!) 144.7 kg (319 lb 0.1 oz), SpO2 100 %.  I/O:    Intake/Output Summary (Last 24 hours) at 03/27/2018 1613 Last data filed at 03/27/2018 1358 Gross per 24 hour  Intake 750 ml  Output 1350 ml  Net -600 ml    PHYSICAL EXAMINATION:  GENERAL:  42 y.o.-year-old patient lying in the bed with no acute distress.  EYES: Pupils equal, round, reactive to light and accommodation. No scleral icterus. Extraocular muscles intact.  HEENT: Head atraumatic, normocephalic. Oropharynx and nasopharynx clear.  NECK:  Supple, no jugular venous distention. No thyroid enlargement, no tenderness.  LUNGS: Normal breath sounds bilaterally, no wheezing, rales,rhonchi or crepitation. No use of accessory muscles of respiration.  CARDIOVASCULAR: S1, S2 normal. No murmurs, rubs, or gallops.  ABDOMEN: Soft, non-tender, non-distended.  Bowel sounds present.  EXTREMITIES: No pedal edema, cyanosis, or clubbing.  NEUROLOGIC: Cranial nerves II through XII are intact. Muscle strength 5/5 in all extremities. Sensation intact. Gait not checked.  PSYCHIATRIC: The patient is alert and oriented x 3.  SKIN: No obvious rash, lesion, or ulcer.   DATA REVIEW:   CBC Recent Labs  Lab 03/27/18 0347  WBC 9.7  HGB 11.8*  HCT 35.9*  PLT 299    Chemistries  Recent Labs  Lab 03/27/18 0347  NA 137  K 3.6  CL 103  CO2 27  GLUCOSE 160*  BUN 11  CREATININE 0.83  CALCIUM 8.5*    Cardiac Enzymes Recent  Labs  Lab 03/26/18 1701  TROPONINI <0.03    Microbiology Results  No results found for this or any previous visit.  RADIOLOGY:  Ct Angio Head W Or Wo Contrast  Result Date: 03/24/2018 CLINICAL DATA:  Severe headache, behind the eyes, with nausea. EXAM: CT ANGIOGRAPHY HEAD TECHNIQUE: Multidetector CT imaging of the head was performed using the standard protocol during bolus administration of intravenous contrast. Multiplanar CT image reconstructions and MIPs were obtained to evaluate the vascular anatomy. CONTRAST:  165m OMNIPAQUE IOHEXOL 350 MG/ML SOLN COMPARISON:  CT head 06/01/2016. FINDINGS: CT HEAD Brain: No evidence of acute infarction, hemorrhage, hydrocephalus, extra-axial collection or mass lesion/mass effect. Vascular: Reported separately. Skull: Normal. Negative for fracture or focal lesion. Sinuses: Imaged portions are clear. Orbits: No acute finding. CTA HEAD Anterior circulation: No significant proximal stenosis, proximal occlusion, aneurysm, or vascular malformation. Mild irregularity of the distal ACA and PCA vessels, suggesting early intracranial atherosclerotic change, likely diabetic related. Posterior circulation: No significant proximal stenosis, proximal occlusion, aneurysm, or vascular malformation mild irregularity of the distal PCA vessels, suggesting early intracranial atherosclerotic change, likely diabetic related. Venous sinuses: As permitted by contrast timing, patent. Anatomic variants: None Delayed phase: No abnormal intracranial enhancement. IMPRESSION: Negative CTA of the head for dissection, aneurysm, or large vessel occlusion. 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 Electronically Signed   By: JStaci RighterM.D.   On: 03/24/2018 19:02    EKG:   Orders placed or performed during the hospital encounter of 03/26/18  . ED EKG  . ED EKG  . EKG 12-Lead  . EKG 12-Lead  .  EKG 12-Lead  . EKG 12-Lead      Management plans discussed with the patient, family and they are in agreement.  CODE STATUS:     Code Status Orders  (From admission, onward)        Start     Ordered   03/26/18 2040  Full code  Continuous     03/26/18 2039    Code Status History    This patient has a current code status but no historical code status.      TOTAL TIME TAKING CARE OF THIS PATIENT: 42 minutes.   Note: This dictation was prepared with Dragon dictation along with smaller phrase technology. Any transcriptional errors that result from this process are unintentional.   '@MEC'$ @  on 03/27/2018 at 4:13 PM  Between 7am to 6pm - Pager - [phone] After 6pm go to [email or web address] - password [email or web address]  Office  [phone] CC: [facility] physician; [facility]

## 2018-03-27 NOTE — Discharge Instructions (Signed)
Follow-up with primary care physician in 2 to 3 days Follow-up with neurology Follow-up with ophthalmology

## 2018-03-27 NOTE — Consult Note (Signed)
Reason for Consult:Migraine Referring Physician: Gouru  CC: Migraine  HPI: Carlos E Noller Jr. is an 42 y.o. male with a history of migraine who presented to the ED 3 days ago with significant headache and migraine.  Plan was to admit to hospital but after getting some medications the patient felt little bit better and he decided to leave AGAINST MEDICAL ADVICE as he had some " important things to take care of" The night prior to admission he could not sleep again because of worsening significant headache and that continued to get worse so came to emergency room today again.  On presentation wbc count elevated.  Had not been febrile.  Headache reportedly a 10/10.  Admitted for further evaluation. Today headache improved significantly.  Rates 2/10.    Past Medical History:  Diagnosis Date  . 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 Diabetes mellitus without complication (HCC)    type 2  . Sickle cell anemia (HCC)    traits    Past Surgical History:  Procedure Laterality Date  . KNEE ARTHROSCOPY    . ROTATOR CUFF REPAIR      Family History  Problem Relation Age of Onset  . Ovarian cancer Mother   . CAD Father     Social History:  reports that he has never smoked. He has never used smokeless tobacco. He reports that he drinks alcohol. He reports that he has current or past drug history. Drug: Marijuana.  No Known Allergies  Medications:  I have reviewed the patient's current medications. Prior to Admission:  Medications Prior to Admission  Medication Sig Dispense Refill Last Dose  . amoxicillin (AMOXIL) 500 MG capsule Take 500 mg by mouth every 8 (eight) hours.   03/25/2018 at 2000  . famotidine (PEPCID) 20 MG tablet Take 1 tablet (20 mg total) by mouth 2 (two) times daily. 60 tablet 0 03/25/2018 at Unknown time  . metoCLOPramide (REGLAN) 10 MG tablet Take 1 tablet (10 mg total) by mouth every 6 (six) hours as needed. 30 tablet 0 PRN at PRN  . naproxen (NAPROSYN) 500 MG tablet Take 1  tablet (500 mg total) by mouth 2 (two) times daily with a meal. 20 tablet 0 03/25/2018 at Unknown time  . [DISCONTINUED] butalbital-acetaminophen-caffeine (FIORICET, ESGIC) 50-325-40 MG tablet Take 1-2 tablets by mouth every 6 (six) hours as needed for headache. 20 tablet 0 PRN at PRN  . [DISCONTINUED] insulin NPH-regular Human (NOVOLIN 70/30) (70-30) 100 UNIT/ML injection Inject 30 Units into the skin 2 (two) times daily with a meal.   03/26/2018 at 0800  . blood glucose meter kit and supplies KIT Dispense based on patient and insurance preference. Use up to four times daily as directed. (FOR ICD-9 250.00, 250.01). 1 each 0  at Unknown time   Scheduled: . heparin  5,000 Units Subcutaneous Q8H  . insulin aspart  0-9 Units Subcutaneous TID WC  . insulin aspart protamine- aspart  25 Units Subcutaneous BID WC  . pneumococcal 23 valent vaccine  0.5 mL Intramuscular Tomorrow-1000    ROS: History obtained from the patient  General ROS: negative for - chills, fatigue, fever, night sweats, weight gain or weight loss Psychological ROS: negative for - behavioral disorder, hallucinations, memory difficulties, mood swings or suicidal ideation Ophthalmic ROS: negative for - blurry vision, double vision, eye pain or loss of vision ENT ROS: negative for - epistaxis, nasal discharge, oral lesions, sore throat, tinnitus or vertigo Allergy and Immunology ROS: negative for - hives or itchy/watery   eyes Hematological and Lymphatic ROS: negative for - bleeding problems, bruising or swollen lymph nodes Endocrine ROS: negative for - galactorrhea, hair pattern changes, polydipsia/polyuria or temperature intolerance Respiratory ROS: negative for - cough, hemoptysis, shortness of breath or wheezing Cardiovascular ROS: negative for - chest pain, dyspnea on exertion, edema or irregular heartbeat Gastrointestinal ROS: negative for - abdominal pain, diarrhea, hematemesis, nausea/vomiting or stool incontinence Genito-Urinary  ROS: negative for - dysuria, hematuria, incontinence or urinary frequency/urgency Musculoskeletal ROS: negative for - joint swelling or muscular weakness Neurological ROS: as noted in HPI Dermatological ROS: negative for rash and skin lesion changes  Physical Examination: Blood pressure 139/82, pulse 63, temperature 98.1 F (36.7 C), temperature source Oral, resp. rate 18, height 5' 11" (1.803 m), weight (!) 144.7 kg (319 lb 0.1 oz), SpO2 100 %.  HEENT-  Normocephalic, no lesions, without obvious abnormality.  Normal external eye and conjunctiva.  Normal TM's bilaterally.  Normal auditory canals and external ears. Normal external nose, mucus membranes and septum.  Normal pharynx. Cardiovascular- S1, S2 normal, pulses palpable throughout   Lungs- chest clear, no wheezing, rales, normal symmetric air entry Abdomen- soft, non-tender; bowel sounds normal; no masses,  no organomegaly Extremities- no edema Lymph-no adenopathy palpable Musculoskeletal-no joint tenderness, deformity or swelling Skin-warm and dry, no hyperpigmentation, vitiligo, or suspicious lesions  Neurological Examination   Mental Status: Alert, oriented, thought content appropriate.  Speech fluent without evidence of aphasia.  Able to follow 3 step commands without difficulty. Cranial Nerves: II: Discs flat bilaterally; Visual fields grossly normal, pupils equal, round, reactive to light and accommodation III,IV, VI: ptosis not present, extra-ocular motions intact bilaterally V,VII: smile symmetric, facial light touch sensation normal bilaterally VIII: hearing normal bilaterally IX,X: gag reflex present XI: bilateral shoulder shrug XII: midline tongue extension Motor: Right : Upper extremity   5/5    Left:     Upper extremity   5/5  Lower extremity   5/5     Lower extremity   5/5 Tone and bulk:normal tone throughout; no atrophy noted Sensory: Pinprick and light touch intact throughout, bilaterally Deep Tendon Reflexes:  2+ in the upper extremities and absent in the lower extremities Plantars: Right: mute   Left: mute Cerebellar: Normal finger-to-nose and normal heel-to-shin testing bilaterally Gait: not tested due to safety concerns    Laboratory Studies:   Basic Metabolic Panel: Recent Labs  Lab 03/23/18 1942 03/24/18 1536 03/26/18 1701 03/27/18 0347  NA 136 133* 133* 137  K 4.2 4.3 3.8 3.6  CL 97* 98 99 103  CO2 31 28 27 27  GLUCOSE 322* 368* 288* 160*  BUN 14 15 13 11  CREATININE 1.02 1.08 0.95 0.83  CALCIUM 9.3 9.2 8.7* 8.5*    Liver Function Tests: No results for input(s): AST, ALT, ALKPHOS, BILITOT, PROT, ALBUMIN in the last 168 hours. No results for input(s): LIPASE, AMYLASE in the last 168 hours. No results for input(s): AMMONIA in the last 168 hours.  CBC: Recent Labs  Lab 03/23/18 1942 03/24/18 1536 03/26/18 1701 03/27/18 0347  WBC 16.2* 17.6* 13.0* 9.7  NEUTROABS 14.1* 15.2* 10.5*  --   HGB 13.4 13.0 12.4* 11.8*  HCT 40.7 39.4* 37.6* 35.9*  MCV 79.9* 79.7* 78.8* 78.8*  PLT 309 356 337 299    Cardiac Enzymes: Recent Labs  Lab 03/26/18 1701  TROPONINI <0.03    BNP: Invalid input(s): POCBNP  CBG: Recent Labs  Lab 03/26/18 2119 03/27/18 0737 03/27/18 1132 03/27/18 1637  GLUCAP 220* 121* 147* 135*      Microbiology: No results found for this or any previous visit.  Coagulation Studies: No results for input(s): LABPROT, INR in the last 72 hours.  Urinalysis: No results for input(s): COLORURINE, LABSPEC, PHURINE, GLUCOSEU, HGBUR, BILIRUBINUR, KETONESUR, PROTEINUR, UROBILINOGEN, NITRITE, LEUKOCYTESUR in the last 168 hours.  Invalid input(s): APPERANCEUR  Lipid Panel:  No results found for: CHOL, TRIG, HDL, CHOLHDL, VLDL, LDLCALC  HgbA1C: No results found for: HGBA1C  Urine Drug Screen:  No results found for: LABOPIA, COCAINSCRNUR, LABBENZ, AMPHETMU, THCU, LABBARB  Alcohol Level: No results for input(s): ETH in the last 168 hours.  Other  results: EKG: sinus arrhythmia at 55 bpm.  Imaging: No results found.   Assessment/Plan: 42 year old male with migraine.  Much improved this morning.  Seemed to improve with Fioricet.  Initially with wbc count that was elevated but today white blood cell count is normal and patient has remained afebrile.  Normal neurological examination. Antibiotics not initiated. LP not indicated.  Imaging reviewed and head CT and CTA of head are unremarkable.    Recommendations: 1.  Patient to follow up with outpatient physician for monitoring and use of Fioricet for abortion of migraine headaches.    No further neurologic intervention is recommended at this time.  If further questions arise, please call or page at that time.  Thank you for allowing neurology to participate in the care of this patient.   Alexis Goodell, MD Neurology [phone] 03/27/2018, 6:13 PM

## 2018-03-27 NOTE — Progress Notes (Signed)
Chaplain responded to an OR for prayer. Pt said he had a headache and wanted prayer for his health and family. Chaplain prayed for the health and healing of the pt , sustaining of his family and the wisdom and blessing of his care team.    03/27/18 1000  Clinical Encounter Type  Visited With Patient  Visit Type Spiritual support  Referral From Nurse  Spiritual Encounters  Spiritual Needs Prayer

## 2018-03-28 LAB — HIV ANTIBODY (ROUTINE TESTING W REFLEX): HIV SCREEN 4TH GENERATION: NONREACTIVE

## 2019-05-09 DIAGNOSIS — E1159 Type 2 diabetes mellitus with other circulatory complications: Secondary | ICD-10-CM | POA: Insufficient documentation

## 2020-06-10 ENCOUNTER — Emergency Department: Payer: Medicaid Other

## 2020-06-10 ENCOUNTER — Other Ambulatory Visit: Payer: Self-pay

## 2020-06-10 ENCOUNTER — Emergency Department
Admission: EM | Admit: 2020-06-10 | Discharge: 2020-06-10 | Disposition: A | Discharge status: Home / Self Care | Payer: Medicaid Other | Attending: Emergency Medicine | Admitting: Emergency Medicine

## 2020-06-10 ENCOUNTER — Encounter: Payer: Self-pay | Admitting: Emergency Medicine

## 2020-06-10 DIAGNOSIS — Y92410 Unspecified street and highway as the place of occurrence of the external cause: Secondary | ICD-10-CM | POA: Diagnosis not present

## 2020-06-10 DIAGNOSIS — E119 Type 2 diabetes mellitus without complications: Secondary | ICD-10-CM | POA: Insufficient documentation

## 2020-06-10 DIAGNOSIS — M79642 Pain in left hand: Secondary | ICD-10-CM | POA: Diagnosis not present

## 2020-06-10 DIAGNOSIS — Z794 Long term (current) use of insulin: Secondary | ICD-10-CM | POA: Insufficient documentation

## 2020-06-10 DIAGNOSIS — M545 Low back pain, unspecified: Secondary | ICD-10-CM

## 2020-06-10 MED ORDER — IBUPROFEN 600 MG PO TABS: 600.0000 mg | ORAL_TABLET | Freq: Four times a day (QID) | ORAL | 0 refills | Status: DC | PRN

## 2020-06-10 MED ORDER — BACLOFEN 5 MG PO TABS: 5.0000 mg | ORAL_TABLET | Freq: Three times a day (TID) | ORAL | 0 refills | Status: DC | PRN
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
## 2020-06-26 ENCOUNTER — Other Ambulatory Visit: Payer: Self-pay

## 2020-06-26 ENCOUNTER — Emergency Department
Admission: EM | Admit: 2020-06-26 | Discharge: 2020-06-26 | Disposition: A | Discharge status: Home / Self Care | Payer: Medicaid Other | Attending: Emergency Medicine | Admitting: Emergency Medicine

## 2020-06-26 ENCOUNTER — Emergency Department: Payer: Medicaid Other

## 2020-06-26 DIAGNOSIS — E119 Type 2 diabetes mellitus without complications: Secondary | ICD-10-CM | POA: Diagnosis not present

## 2020-06-26 DIAGNOSIS — Z794 Long term (current) use of insulin: Secondary | ICD-10-CM | POA: Insufficient documentation

## 2020-06-26 DIAGNOSIS — M79642 Pain in left hand: Secondary | ICD-10-CM | POA: Diagnosis present

## 2020-06-26 LAB — BASIC METABOLIC PANEL
Anion gap: 8 (ref 5–15)
BUN: 17 mg/dL (ref 6–20)
CO2: 31 mmol/L (ref 22–32)
Calcium: 8.9 mg/dL (ref 8.9–10.3)
Chloride: 97 mmol/L — ABNORMAL LOW (ref 98–111)
Creatinine, Ser: 1 mg/dL (ref 0.61–1.24)
GFR, Estimated: >60 mL/min (ref >60)
Glucose, Bld: 193 mg/dL — ABNORMAL HIGH (ref 70–99)
Potassium: 4.8 mmol/L (ref 3.5–5.1)
Sodium: 136 mmol/L (ref 135–145)

## 2020-06-26 LAB — CBC WITH DIFFERENTIAL/PLATELET
Abs Immature Granulocytes: 0.11 10*3/uL — ABNORMAL HIGH (ref 0.00–0.07)
Basophils Absolute: 0 10*3/uL (ref 0.0–0.1)
Basophils Relative: 0 %
Eosinophils Absolute: 0.2 10*3/uL (ref 0.0–0.5)
Eosinophils Relative: 2 %
HCT: 46.6 % (ref 39.0–52.0)
Hemoglobin: 14.8 g/dL (ref 13.0–17.0)
Immature Granulocytes: 1 %
Lymphocytes Relative: 12 %
Lymphs Abs: 1.4 10*3/uL (ref 0.7–4.0)
MCH: 25 pg — ABNORMAL LOW (ref 26.0–34.0)
MCHC: 31.8 g/dL (ref 30.0–36.0)
MCV: 78.7 fL — ABNORMAL LOW (ref 80.0–100.0)
Monocytes Absolute: 0.6 10*3/uL (ref 0.1–1.0)
Monocytes Relative: 5 %
Neutro Abs: 9 10*3/uL — ABNORMAL HIGH (ref 1.7–7.7)
Neutrophils Relative %: 80 %
Platelets: 271 10*3/uL (ref 150–400)
RBC: 5.92 MIL/uL — ABNORMAL HIGH (ref 4.22–5.81)
RDW: 17.5 % — ABNORMAL HIGH (ref 11.5–15.5)
WBC: 11.3 10*3/uL — ABNORMAL HIGH (ref 4.0–10.5)
nRBC: 0 % (ref 0.0–0.2)

## 2020-06-26 LAB — URIC ACID: Uric Acid, Serum: 8.9 mg/dL — ABNORMAL HIGH (ref 3.7–8.6)

## 2020-06-26 LAB — SEDIMENTATION RATE: Sed Rate: 17 mm/hr — ABNORMAL HIGH (ref 0–15)

## 2020-06-26 MED ORDER — PREDNISONE 10 MG (21) PO TBPK: ORAL_TABLET | ORAL | 0 refills | Status: DC

## 2020-06-26 MED ORDER — IOHEXOL 350 MG/ML SOLN
125.0000 mL | Freq: Once | INTRAVENOUS | Status: AC | PRN
  Administered 2020-06-26: 125 mL via INTRAVENOUS
  Filled 2020-06-26: qty 125

## 2020-06-26 MED ORDER — DEXAMETHASONE SODIUM PHOSPHATE 10 MG/ML IJ SOLN
10.0000 mg | Freq: Once | INTRAMUSCULAR | Status: AC
  Administered 2020-06-26: 10 mg via INTRAVENOUS
  Filled 2020-06-26: qty 1
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
## 2020-06-26 NOTE — Discharge Instructions (Addendum)
Follow-up with orthopedics.  Please call in the morning for an appointment.  Ask for the hand specialist.  Return emergency department worsening.  Take the steroid as prescribed.  Keep the hand elevated as if you are holding your base guitar.  Your hand should be at the level of your chest

## 2020-06-26 NOTE — ED Notes (Signed)
Lab contacted to add on uric acid
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
## 2021-08-11 ENCOUNTER — Encounter: Payer: Self-pay | Admitting: Emergency Medicine

## 2021-08-11 ENCOUNTER — Inpatient Hospital Stay
Admission: EM | Admit: 2021-08-11 | Discharge: 2021-08-13 | DRG: 603 | Disposition: A | Discharge status: Home / Self Care | Payer: Medicaid Other | Attending: Internal Medicine | Admitting: Internal Medicine

## 2021-08-11 ENCOUNTER — Emergency Department: Payer: Medicaid Other

## 2021-08-11 ENCOUNTER — Other Ambulatory Visit: Payer: Self-pay

## 2021-08-11 DIAGNOSIS — Z79899 Other long term (current) drug therapy: Secondary | ICD-10-CM

## 2021-08-11 DIAGNOSIS — I10 Essential (primary) hypertension: Secondary | ICD-10-CM | POA: Diagnosis present

## 2021-08-11 DIAGNOSIS — Z794 Long term (current) use of insulin: Secondary | ICD-10-CM

## 2021-08-11 DIAGNOSIS — D571 Sickle-cell disease without crisis: Secondary | ICD-10-CM | POA: Diagnosis present

## 2021-08-11 DIAGNOSIS — E119 Type 2 diabetes mellitus without complications: Secondary | ICD-10-CM | POA: Diagnosis present

## 2021-08-11 DIAGNOSIS — Z20822 Contact with and (suspected) exposure to covid-19: Secondary | ICD-10-CM | POA: Diagnosis present

## 2021-08-11 DIAGNOSIS — Z7984 Long term (current) use of oral hypoglycemic drugs: Secondary | ICD-10-CM | POA: Diagnosis not present

## 2021-08-11 DIAGNOSIS — L03114 Cellulitis of left upper limb: Secondary | ICD-10-CM | POA: Diagnosis present

## 2021-08-11 DIAGNOSIS — I82409 Acute embolism and thrombosis of unspecified deep veins of unspecified lower extremity: Secondary | ICD-10-CM | POA: Diagnosis present

## 2021-08-11 LAB — CBC WITH DIFFERENTIAL/PLATELET
Abs Immature Granulocytes: 0.07 10*3/uL (ref 0.00–0.07)
Basophils Absolute: 0 10*3/uL (ref 0.0–0.1)
Basophils Relative: 0 %
Eosinophils Absolute: 0.2 10*3/uL (ref 0.0–0.5)
Eosinophils Relative: 2 %
HCT: 44.7 % (ref 39.0–52.0)
Hemoglobin: 14.3 g/dL (ref 13.0–17.0)
Immature Granulocytes: 1 %
Lymphocytes Relative: 14 %
Lymphs Abs: 1.4 10*3/uL (ref 0.7–4.0)
MCH: 25.7 pg — ABNORMAL LOW (ref 26.0–34.0)
MCHC: 32 g/dL (ref 30.0–36.0)
MCV: 80.4 fL (ref 80.0–100.0)
Monocytes Absolute: 0.5 10*3/uL (ref 0.1–1.0)
Monocytes Relative: 5 %
Neutro Abs: 8 10*3/uL — ABNORMAL HIGH (ref 1.7–7.7)
Neutrophils Relative %: 78 %
Platelets: 266 10*3/uL (ref 150–400)
RBC: 5.56 MIL/uL (ref 4.22–5.81)
RDW: 17.1 % — ABNORMAL HIGH (ref 11.5–15.5)
WBC: 10.2 10*3/uL (ref 4.0–10.5)
nRBC: 0 % (ref 0.0–0.2)

## 2021-08-11 LAB — COMPREHENSIVE METABOLIC PANEL
ALT: 24 U/L (ref 0–44)
AST: 27 U/L (ref 15–41)
Albumin: 3.3 g/dL — ABNORMAL LOW (ref 3.5–5.0)
Alkaline Phosphatase: 40 U/L (ref 38–126)
Anion gap: 5 (ref 5–15)
BUN: 16 mg/dL (ref 6–20)
CO2: 28 mmol/L (ref 22–32)
Calcium: 8.8 mg/dL — ABNORMAL LOW (ref 8.9–10.3)
Chloride: 103 mmol/L (ref 98–111)
Creatinine, Ser: 0.91 mg/dL (ref 0.61–1.24)
GFR, Estimated: >60 mL/min (ref >60)
Glucose, Bld: 152 mg/dL — ABNORMAL HIGH (ref 70–99)
Potassium: 4.3 mmol/L (ref 3.5–5.1)
Sodium: 136 mmol/L (ref 135–145)
Total Bilirubin: 0.7 mg/dL (ref 0.3–1.2)
Total Protein: 7 g/dL (ref 6.5–8.1)

## 2021-08-11 LAB — CBG MONITORING, ED: Glucose-Capillary: 167 mg/dL — ABNORMAL HIGH (ref 70–99)

## 2021-08-11 LAB — URIC ACID: Uric Acid, Serum: 8.4 mg/dL (ref 3.7–8.6)

## 2021-08-11 MED ORDER — SODIUM CHLORIDE 0.9 % IV SOLN: INTRAVENOUS | Status: DC

## 2021-08-11 MED ORDER — ONDANSETRON HCL 4 MG PO TABS: 4.0000 mg | ORAL_TABLET | Freq: Four times a day (QID) | ORAL | Status: DC | PRN

## 2021-08-11 MED ORDER — VANCOMYCIN HCL IN DEXTROSE 1-5 GM/200ML-% IV SOLN
1000.0000 mg | Freq: Once | INTRAVENOUS | Status: AC
  Administered 2021-08-11: 22:00:00 1000 mg via INTRAVENOUS
  Filled 2021-08-11: qty 200

## 2021-08-11 MED ORDER — OXYCODONE-ACETAMINOPHEN 5-325 MG PO TABS
1.0000 | ORAL_TABLET | Freq: Once | ORAL | Status: AC
  Administered 2021-08-11: 20:00:00 1 via ORAL
  Filled 2021-08-11: qty 1

## 2021-08-11 MED ORDER — ACETAMINOPHEN 650 MG RE SUPP
650.0000 mg | Freq: Four times a day (QID) | RECTAL | Status: DC | PRN
  Filled 2021-08-11: qty 1

## 2021-08-11 MED ORDER — INSULIN ASPART 100 UNIT/ML IJ SOLN
0.0000 [IU] | Freq: Three times a day (TID) | INTRAMUSCULAR | Status: DC
  Administered 2021-08-12 (×2): 2 [IU] via SUBCUTANEOUS
  Filled 2021-08-11 (×3): qty 1

## 2021-08-11 MED ORDER — MORPHINE SULFATE (PF) 2 MG/ML IV SOLN
2.0000 mg | INTRAVENOUS | Status: DC | PRN
  Administered 2021-08-12 (×4): 2 mg via INTRAVENOUS
  Filled 2021-08-11 (×4): qty 1

## 2021-08-11 MED ORDER — ONDANSETRON HCL 4 MG/2ML IJ SOLN
4.0000 mg | Freq: Four times a day (QID) | INTRAMUSCULAR | Status: DC | PRN
  Administered 2021-08-12: 02:00:00 4 mg via INTRAVENOUS
  Filled 2021-08-11: qty 2

## 2021-08-11 MED ORDER — TRAZODONE HCL 50 MG PO TABS: 25.0000 mg | ORAL_TABLET | Freq: Every evening | ORAL | Status: DC | PRN

## 2021-08-11 MED ORDER — VANCOMYCIN HCL IN DEXTROSE 1-5 GM/200ML-% IV SOLN: 1000.0000 mg | Freq: Once | INTRAVENOUS | Status: DC

## 2021-08-11 MED ORDER — SODIUM CHLORIDE 0.9 % IV SOLN: 2.0000 g | Freq: Once | INTRAVENOUS | Status: DC

## 2021-08-11 MED ORDER — ACETAMINOPHEN 325 MG PO TABS: 650.0000 mg | ORAL_TABLET | Freq: Four times a day (QID) | ORAL | Status: DC | PRN

## 2021-08-11 MED ORDER — ENOXAPARIN SODIUM 80 MG/0.8ML IJ SOSY
70.0000 mg | PREFILLED_SYRINGE | Freq: Every day | INTRAMUSCULAR | Status: DC
  Administered 2021-08-11 – 2021-08-12 (×2): 70 mg via SUBCUTANEOUS
  Filled 2021-08-11 (×3): qty 0.7

## 2021-08-11 MED ORDER — SODIUM CHLORIDE 0.9 % IV SOLN
2.0000 g | Freq: Three times a day (TID) | INTRAVENOUS | Status: DC
  Administered 2021-08-11 – 2021-08-13 (×5): 2 g via INTRAVENOUS
  Filled 2021-08-11 (×10): qty 2

## 2021-08-11 MED ORDER — LOSARTAN POTASSIUM 50 MG PO TABS
50.0000 mg | ORAL_TABLET | Freq: Every day | ORAL | Status: DC
  Administered 2021-08-12 – 2021-08-13 (×2): 50 mg via ORAL
  Filled 2021-08-11 (×2): qty 1

## 2021-08-11 MED ORDER — KETOROLAC TROMETHAMINE 30 MG/ML IJ SOLN
15.0000 mg | Freq: Four times a day (QID) | INTRAMUSCULAR | Status: DC | PRN
  Administered 2021-08-12: 05:00:00 15 mg via INTRAVENOUS
  Filled 2021-08-11 (×2): qty 1

## 2021-08-11 MED ORDER — VANCOMYCIN HCL 1500 MG/300ML IV SOLN
1500.0000 mg | Freq: Two times a day (BID) | INTRAVENOUS | Status: DC
  Administered 2021-08-12 – 2021-08-13 (×4): 1500 mg via INTRAVENOUS
  Filled 2021-08-11 (×5): qty 300

## 2021-08-11 NOTE — H&P (Signed)
[place]   PATIENT NAME: Carlos Hall    MR#:  [id number]  DATE OF BIRTH:  Nov 27, 1975  DATE OF ADMISSION:  08/11/2021  PRIMARY CARE PHYSICIAN: [facility] [place]   Patient is coming from: Home  REQUESTING/REFERRING PHYSICIAN: Harvest Dark, MD  CHIEF COMPLAINT:   Chief Complaint  Patient presents with   Hand Pain    HISTORY OF PRESENT ILLNESS:  Carlos Hall. is a 45 y.o. African-American male with medical history significant for type 2 diabetes mellitus and sickle cell anemia, presented to the ER with acute onset of left hand swelling with associated erythema, tenderness and pain.  This has been worsening over the last week and extended to the MCP of the index finger.  No recent injuries or falls or trauma or wounds.  He denies any fever or chills.  No chest pain or dyspnea or cough or wheezing or hemoptysis.  He stated the swelling has been slightly improving over the last few days however got significantly worse the last 24 to 48 hours.  No nausea or vomiting or abdominal pain.  No dysuria, oliguria or hematuria or flank pain.  He denies any IV drug abuse  ED Course: When he came to the ER blood pressure was 149/96 with otherwise normal vital signs.  Labs revealed a blood glucose of 152 and albumin of 3.3 with otherwise unremarkable CMP.  CBC was within normal.  Blood cultures were drawn.  Imaging: Left hand x-ray revealed marked subcutaneous soft tissue edema prominent about the dorsum of the hand consistent with cellulitis without evidence of radiopaque foreign body.  No cortical erosion or periosteal reaction concerning for osteomyelitis.  The patient was given IV vancomycin and Percocet.  He will be admitted to a medical bed for further evaluation and management.  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 All pertinent systems were reviewed above. Constitutional, HEENT, cardiovascular, respiratory, GI, GU, musculoskeletal, neuro, psychiatric, endocrine, integumentary and hematologic systems were reviewed and are otherwise negative/unremarkable except for positive findings mentioned above in the HPI.   MEDICATIONS AT HOME:   Prior to Admission medications   Medication Sig Start Date End Date Taking? Authorizing Provider  insulin NPH-regular Human (NOVOLIN 70/30) (70-30) 100 UNIT/ML injection Inject 30 Units into the skin 2 (two) times daily with a meal. 03/27/18  Yes Gouru, Aruna, MD  losartan (COZAAR) 50 MG tablet Take 50 mg by mouth daily. 03/30/21  Yes [provider]  testosterone cypionate (DEPOTESTOSTERONE CYPIONATE) 200 MG/ML injection Inject 200 mg into the muscle every 14 (fourteen) days. 08/04/21  Yes [provider]  TRULICITY 1.5 0000000 SOPN Inject 1.5 mg  into the skin once a week. 08/03/21  Yes [provider]  sildenafil (VIAGRA) 100 MG tablet Take 100 mg by mouth daily as needed. 05/05/21   [provider]  terbinafine (LAMISIL) 250 MG tablet Take 250 mg by mouth daily. Patient not taking: Reported on 08/11/2021 05/06/21   [provider]      VITAL SIGNS:  Blood pressure (!) 149/96, pulse 98, temperature 98.3 F (36.8 C), temperature source Oral, resp. rate 18, height 5\' 11"  (1.803 m), weight (!) 145.2 kg, SpO2 93  %.  PHYSICAL EXAMINATION:  Physical Exam  GENERAL:  45 y.o.-year-old African-American male patient lying in the bed with no acute distress.  EYES: Pupils equal, round, reactive to light and accommodation. No scleral icterus. Extraocular muscles intact.  HEENT: Head atraumatic, normocephalic. Oropharynx and nasopharynx clear.  NECK:  Supple, no jugular venous distention. No thyroid enlargement, no tenderness.  LUNGS: Normal breath sounds bilaterally, no wheezing, rales,rhonchi or crepitation. No use of accessory muscles of respiration.  CARDIOVASCULAR: Regular rate and rhythm, S1, S2 normal. No murmurs, rubs, or gallops.  ABDOMEN: Soft, nondistended, nontender. Bowel sounds present. No organomegaly or mass.  EXTREMITIES: No pedal edema, cyanosis, or clubbing.  NEUROLOGIC: Cranial nerves II through XII are intact. Muscle strength 5/5 in all extremities. Sensation intact. Gait not checked.  PSYCHIATRIC: The patient is alert and oriented x 3.  Normal affect and good eye contact. SKIN: Left dorsal lateral hand swelling with associated erythema, warmth and induration extending to the thumb and tenderness without fluctuation as shown below.     LABORATORY PANEL:   CBC Recent Labs  Lab 08/11/21 1741  WBC 10.2  HGB 14.3  HCT 44.7  PLT 266   ------------------------------------------------------------------------------------------------------------------  Chemistries  Recent Labs  Lab 08/11/21 1741  NA 136  K 4.3  CL 103  CO2 28  GLUCOSE 152*  BUN 16  CREATININE 0.91  CALCIUM 8.8*  AST 27  ALT 24  ALKPHOS 40  BILITOT 0.7   ------------------------------------------------------------------------------------------------------------------  Cardiac Enzymes No results for input(s): TROPONINI in the last 168 hours. ------------------------------------------------------------------------------------------------------------------  RADIOLOGY:  DG Hand Complete Left  Result  Date: 08/11/2021 CLINICAL DATA:  Left arm pain, infection. Redness and swelling in his left hand that began approximately 1 week prior. Denies any injury. Patient unable to straighten his fingers. EXAM: LEFT HAND - COMPLETE 3+ VIEW COMPARISON:  None. FINDINGS: There is no evidence of fracture or dislocation. No cortical erosion or periosteal reaction. There is no evidence of arthropathy or other focal bone abnormality. There is marked subcutaneous soft tissue swelling prominent about the dorsum of the hand. No radiopaque foreign body. IMPRESSION: 1. Marked subcutaneous soft tissue edema prominent about the dorsum of the hand consistent with cellulitis without evidence of radiopaque foreign body. 2. No cortical erosion or periosteal reaction concerning for osteomyelitis. Electronically Signed   By: 08/13/2021 D.O.   On: 08/11/2021 18:15      IMPRESSION AND PLAN:  Principal Problem:   Cellulitis of left hand  1.  Left hand moderately severe nonpurulent cellulitis. - The patient was admitted to a medical bed. - Continue antibiotic therapy with IV vancomycin and cefepime. - Warm compresses will be applied. - Pain management will be provided.  2.  Type 2 diabetes mellitus. - The patient will be placed on supplement coverage with NovoLog. - We will continue his basal coverage.  3.  Essential hypertension. - We will continue his Cozaar.  DVT prophylaxis: Lovenox. Code Status: full code.  Family Communication:  The plan of care was discussed in details with the patient (and family). I answered all questions. The patient agreed to proceed with the above mentioned plan. Further management will depend upon hospital course. Disposition Plan: Back to previous home environment Consults called: none. All the records are reviewed and case discussed with ED provider.  Status is: Inpatient   Remains inpatient appropriate because:Ongoing diagnostic testing needed not appropriate for outpatient work  up, Unsafe d/c plan, IV treatments appropriate due to intensity of illness or inability to take PO, and Inpatient level of care appropriate due to severity of illness   Dispo: The patient is from: Home              Anticipated d/c is to: Home              Patient currently is not medically stable to d/c.              Difficult to place patient: No    Christel Mormon M.D on 08/11/2021 at 9:49 PM  [facility]   From 7 PM-7 AM, contact night-coverage [email or web address]  CC: Primary care physician; [facility], [facility]

## 2021-08-11 NOTE — ED Triage Notes (Signed)
Pt to ED via POV with c/o left hand pain, it has been hurting him for about a week now. It is red, swollen and warm to touch. No known injury.

## 2021-08-11 NOTE — Consult Note (Signed)
Pharmacy Antibiotic Note  Carlos Hall. is a 45 y.o. male admitted on 08/11/2021 with cellulitis.  Pharmacy has been consulted for vancomycin and cefepime dosing.  Plan: Cefepime 2 gram Q8H Vancomycin 2500 mg LD x 1 followed by Initiate Vancomycin 1500 mg Q12H. Goal AUC 400-550 Estimated AUC 434/Cmin: 11.2 Scr 0.91, IBW, Vd 0.72    Height: 5\' 11"  (180.3 cm) Weight: (!) 145.2 kg (320 lb) IBW/kg (Calculated) : 75.3  Temp (24hrs), Avg:98.3 F (36.8 C), Min:98.3 F (36.8 C), Max:98.3 F (36.8 C)  Recent Labs  Lab 08/11/21 1741  WBC 10.2  CREATININE 0.91    Estimated Creatinine Clearance: 149.8 mL/min (by C-G formula based on SCr of 0.91 mg/dL).    No Known Allergies  Antimicrobials this admission: 12/20 cefepime >>  12/20 vancomycin >>   Dose adjustments this admission:   Microbiology results: 12/20 BCx: sent   Thank you for allowing pharmacy to be a part of this patients care.  1/21, PharmD, BCPS Clinical Pharmacist   08/11/2021 10:27 PM

## 2021-08-11 NOTE — ED Provider Notes (Signed)
°  Emergency Medicine Provider Triage Evaluation Note  Keith Rake. , a 45 y.o.male,  was evaluated in triage.  Pt complains of left hand pain.  Patient states that he began noticing redness and swelling in his left hand that began approximately 1 week prior.  Denies any injury or recent illness.  Denies fever/chills, shortness of breath, chest pain, or pain in the forearm/elbow/shoulder/arm.   Review of Systems  Positive: Left hand pain Negative: Denies fever, chest pain, vomiting  Physical Exam   Vitals:   08/11/21 1735  BP: (!) 149/96  Pulse: 98  Resp: 18  Temp: 98.3 F (36.8 C)  SpO2: 93%   Gen:   Awake, no distress   Resp:  Normal effort  MSK:   Moves extremities without difficulty  Other:  Significant erythema and swelling in the left hand diffusely.  Limited range of motion.  Medical Decision Making  Given the patient's initial medical screening exam, the following diagnostic evaluation has been ordered. The patient will be placed in the appropriate treatment space, once one is available, to complete the evaluation and treatment. I have discussed the plan of care with the patient and I have advised the patient that an ED physician or mid-level practitioner will reevaluate their condition after the test results have been received, as the results may give them additional insight into the type of treatment they may need.    Diagnostics: Left hand x-ray  Treatments: none immediately   Varney Daily, PA 08/11/21 1738    Gilles Chiquito, MD 08/11/21 1946

## 2021-08-11 NOTE — ED Notes (Signed)
This RN attempted to obtain PIV access without success.

## 2021-08-11 NOTE — ED Provider Notes (Signed)
[facility] Emergency Department Provider Note  Time seen: 8:14 PM  I have reviewed the triage vital signs and the nursing notes.   HISTORY  Chief Complaint Hand Pain   HPI Carlos Hall. is a 45 y.o. male with a past medical history of diabetes, presents to the emergency department for left hand pain and swelling.  According to the patient the past 1 week he has had significant swelling to the left hand and pain at the MCP joint of the index finger.  Patient denies any trauma denies any cuts or scrapes to the area.  No fever.  States the swelling was going down several days ago but over the past 24 to 48 hours has begun swelling once again.  Patient denies any history of gout but states his father has gout.   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 Authorizing Provider  Baclofen 5 MG TABS Take 5 mg by mouth 3 (three) times daily as needed. 06/10/20   Laban Emperor, PA-C  blood glucose meter kit and supplies KIT Dispense based on patient and insurance preference. Use up to four times daily as directed. (FOR ICD-9 250.00, 250.01). 08/23/17   Delman Kitten, MD  docusate sodium (COLACE) 100 MG capsule Take 1 capsule (100 mg total) by mouth 2 (two) times daily as needed for mild constipation. 03/27/18   Nicholes Mango, MD  famotidine (PEPCID) 20 MG tablet Take 1 tablet (20 mg total) by mouth 2 (two) times daily. 03/23/18   Carrie Mew, MD  ibuprofen (ADVIL) 600 MG tablet Take 1 tablet (600 mg total) by mouth every 6 (six) hours as needed.  06/10/20   Laban Emperor, PA-C  insulin NPH-regular Human (NOVOLIN 70/30) (70-30) 100 UNIT/ML injection Inject 30 Units into the skin 2 (two) times daily with a meal. 03/27/18   Gouru, Illene Silver, MD  metoCLOPramide (REGLAN) 10 MG tablet Take 1 tablet (10 mg total) by mouth every 6 (six) hours as needed. 03/23/18   Carrie Mew, MD  predniSONE (STERAPRED UNI-PAK 21 TAB) 10 MG (21) TBPK tablet Take 6 pills on day one then decrease by 1 pill each day 06/26/20   Versie Starks, PA-C    No Known Allergies  Family History  Problem Relation Age of Onset   Ovarian cancer Mother    CAD Father     Social History Social History   Tobacco Use   Smoking status: Never   Smokeless tobacco: Never  Vaping Use   Vaping Use: Never used  Substance Use Topics   Alcohol use: Yes   Drug use: Yes    Types: Marijuana    Review of Systems Constitutional: Negative for fever. Cardiovascular: Negative for chest pain. Respiratory: Negative for shortness of breath. Gastrointestinal: Negative for abdominal pain Musculoskeletal: Left hand swelling Neurological: Negative for headache All other ROS negative  ____________________________________________   PHYSICAL EXAM:  VITAL SIGNS: ED Triage Vitals  Enc Vitals Group     BP  08/11/21 1735 (!) 149/96     Pulse Rate 08/11/21 1735 98     Resp 08/11/21 1735 18     Temp 08/11/21 1735 98.3 F (36.8 C)     Temp Source 08/11/21 1735 Oral     SpO2 08/11/21 1735 93 %     Weight 08/11/21 1738 (!) 320 lb (145.2 kg)     Height 08/11/21 1738 5' 11" (1.803 m)     Head Circumference --      Peak Flow --      Pain Score 08/11/21 1738 8     Pain Loc --      Pain Edu? --      Excl. in [place]? --    Constitutional: Alert and oriented. Well appearing and in no distress. Eyes: Normal exam ENT      Head: Normocephalic and atraumatic.      Mouth/Throat: Mucous membranes are moist. Cardiovascular: Normal rate, regular rhythm.  Respiratory: Normal respiratory effort  without tachypnea nor retractions. Breath sounds are clear  Gastrointestinal: Soft and nontender. No distention. Musculoskeletal: Moderate swelling of the dorsal aspect of the left hand with moderate tenderness to palpation to the MCP joint of the index finger.  Neuro vastly intact distally in all fingers.  2+ radial pulse.  Good range of motion in the wrist without any pain elicited. Neurologic:  Normal speech and language. No gross focal neurologic deficits  Skin:  Skin is warm, dry and intact.  No significant erythema. Psychiatric: Mood and affect are normal.   ____________________________________________     RADIOLOGY  X-ray shows marked subcutaneous soft tissue edema to the dorsal aspect of the hand  ____________________________________________   INITIAL IMPRESSION / ASSESSMENT AND PLAN / ED COURSE  Pertinent labs & imaging results that were available during my care of the patient were reviewed by me and considered in my medical decision making (see chart for details).   Patient presents emergency department for left hand pain and swelling.  States most of the pain is in the MCP joint of the left index finger.  Patient has moderate tenderness to this area as well.  No significant erythema.  Father has a history of gout.  Differential would include gout or cellulitis.  Patient is neuro vastly intact throughout the hand.  Lab work is largely nonrevealing, reassuringly normal white blood cell count.  I have added on a uric acid level.  Uric acid level is normal.  Given the normal uric acid level and significant left hand swelling we will send blood cultures and start the patient on IV vancomycin and admit to the hospitalist service for likely cellulitis.  Carlos Hall. was evaluated in Emergency Department on 08/11/2021 for the symptoms described in the history of present illness. He was evaluated in the context of the global COVID-19 pandemic, which necessitated consideration  that the patient might be at risk for infection with the SARS-CoV-2 virus that causes COVID-19. Institutional protocols and algorithms that pertain to the evaluation of patients at risk for COVID-19 are in a state of rapid change based on information released by regulatory bodies including the CDC and federal and state organizations. These policies and algorithms were followed during the patient's care in the ED.  ____________________________________________   FINAL CLINICAL IMPRESSION(S) / ED DIAGNOSES  Left hand swelling   Harvest Dark, MD 08/11/21 2042

## 2021-08-12 ENCOUNTER — Inpatient Hospital Stay: Payer: Medicaid Other

## 2021-08-12 ENCOUNTER — Encounter: Payer: Self-pay | Admitting: Family Medicine

## 2021-08-12 DIAGNOSIS — L03114 Cellulitis of left upper limb: Secondary | ICD-10-CM | POA: Diagnosis not present

## 2021-08-12 LAB — GLUCOSE, CAPILLARY
Glucose-Capillary: 112 mg/dL — ABNORMAL HIGH (ref 70–99)
Glucose-Capillary: 172 mg/dL — ABNORMAL HIGH (ref 70–99)

## 2021-08-12 LAB — RESP PANEL BY RT-PCR (FLU A&B, COVID) ARPGX2
Influenza A by PCR: NEGATIVE
Influenza B by PCR: NEGATIVE
SARS Coronavirus 2 by RT PCR: NEGATIVE

## 2021-08-12 LAB — CBC
HCT: 45.1 % (ref 39.0–52.0)
Hemoglobin: 14.4 g/dL (ref 13.0–17.0)
MCH: 26 pg (ref 26.0–34.0)
MCHC: 31.9 g/dL (ref 30.0–36.0)
MCV: 81.4 fL (ref 80.0–100.0)
Platelets: 242 10*3/uL (ref 150–400)
RBC: 5.54 MIL/uL (ref 4.22–5.81)
RDW: 17.2 % — ABNORMAL HIGH (ref 11.5–15.5)
WBC: 9.4 10*3/uL (ref 4.0–10.5)
nRBC: 0 % (ref 0.0–0.2)

## 2021-08-12 LAB — BASIC METABOLIC PANEL
Anion gap: 4 — ABNORMAL LOW (ref 5–15)
BUN: 17 mg/dL (ref 6–20)
CO2: 28 mmol/L (ref 22–32)
Calcium: 8.5 mg/dL — ABNORMAL LOW (ref 8.9–10.3)
Chloride: 102 mmol/L (ref 98–111)
Creatinine, Ser: 1.07 mg/dL (ref 0.61–1.24)
GFR, Estimated: >60 mL/min (ref >60)
Glucose, Bld: 149 mg/dL — ABNORMAL HIGH (ref 70–99)
Potassium: 4.6 mmol/L (ref 3.5–5.1)
Sodium: 134 mmol/L — ABNORMAL LOW (ref 135–145)

## 2021-08-12 LAB — CBG MONITORING, ED
Glucose-Capillary: 120 mg/dL — ABNORMAL HIGH (ref 70–99)
Glucose-Capillary: 158 mg/dL — ABNORMAL HIGH (ref 70–99)

## 2021-08-12 LAB — SEDIMENTATION RATE: Sed Rate: 7 mm/hr (ref 0–15)

## 2021-08-12 LAB — HIV ANTIBODY (ROUTINE TESTING W REFLEX): HIV Screen 4th Generation wRfx: NONREACTIVE

## 2021-08-12 NOTE — ED Notes (Signed)
ED TO INPATIENT HANDOFF REPORT  ED Nurse Name and Phone #: Baxter Flattery, RN   S Name/Age/Gender Carlos Hall 45 y.o. male Room/Bed: ED30A/ED30A  Code Status   Code Status: Full Code  Home/SNF/Other Home Patient oriented to: self, place, time, and situation Is this baseline? Yes   Triage Complete: Triage complete  Chief Complaint Cellulitis of left hand [U31.497]  Triage Note Pt to ED via POV with c/o left hand pain, it has been hurting him for about a week now. It is red, swollen and warm to touch. No known injury.    Allergies No Known Allergies  Level of Care/Admitting Diagnosis ED Disposition     ED Disposition  Admit   Condition  --   Comment  Hospital Area: [facility] [[id number]]  Level of Care: Med-Surg [16]  Covid Evaluation: Asymptomatic Screening Protocol (No Symptoms)  Diagnosis: Cellulitis of left hand [026378]  Admitting Physician: Christel Mormon [[id number]]  Attending Physician: Christel Mormon [[id number]]  Estimated length of stay: past midnight tomorrow  Certification:: I certify this patient will need inpatient services for at least 2 midnights          B Medical/Surgery History Past Medical History:  Diagnosis Date   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    Diabetes mellitus without complication ([place])    type 2   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   Past Surgical History:  Procedure Laterality Date   KNEE ARTHROSCOPY     ROTATOR CUFF REPAIR       A IV Location/Drains/Wounds Patient Lines/Drains/Airways Status     Active Line/Drains/Airways     Name Placement date Placement time Site Days   Peripheral IV 08/11/21 20 G Posterior;Right Hand 08/11/21  2102  Hand  1            Intake/Output Last 24 hours No intake or output data in the 24 hours ending 08/12/21 1300  Labs/Imaging Results for orders placed or performed during the hospital encounter of 08/11/21 (from the past 48 hour(s))  CBC with Differential      Status: Abnormal   Collection Time: 08/11/21  5:41 PM  Result Value Ref Range   WBC 10.2 4.0 - 10.5 K/uL   RBC 5.56 4.22 - 5.81 MIL/uL   Hemoglobin 14.3 13.0 - 17.0 g/dL   HCT 44.7 39.0 - 52.0 %   MCV 80.4 80.0 - 100.0 fL   MCH 25.7 (L) 26.0 - 34.0 pg   MCHC 32.0 30.0 - 36.0 g/dL   RDW 17.1 (H) 11.5 - 15.5 %   Platelets 266 150 - 400 K/uL   nRBC 0.0 0.0 - 0.2 %   Neutrophils Relative % 78 %   Neutro Abs 8.0 (H) 1.7 - 7.7 K/uL   Lymphocytes Relative 14 %   Lymphs Abs 1.4 0.7 - 4.0 K/uL   Monocytes Relative 5 %   Monocytes Absolute 0.5 0.1 - 1.0 K/uL   Eosinophils Relative 2 %   Eosinophils Absolute 0.2 0.0 - 0.5 K/uL   Basophils Relative 0 %   Basophils Absolute 0.0 0.0 - 0.1 K/uL   Immature Granulocytes 1 %   Abs Immature Granulocytes 0.07 0.00 - 0.07 K/uL    Comment: Performed at [facility], [address] [place], [place] [address]  Comprehensive metabolic panel     Status: Abnormal   Collection Time: 08/11/21  5:41 PM  Result Value Ref Range   Sodium 136 135 - 145 mmol/L  Potassium 4.3 3.5 - 5.1 mmol/L   Chloride 103 98 - 111 mmol/L   CO2 28 22 - 32 mmol/L   Glucose, Bld 152 (H) 70 - 99 mg/dL    Comment: Glucose reference range applies only to samples taken after fasting for at least 8 hours.   BUN 16 6 - 20 mg/dL   Creatinine, Ser 0.91 0.61 - 1.24 mg/dL   Calcium 8.8 (L) 8.9 - 10.3 mg/dL   Total Protein 7.0 6.5 - 8.1 g/dL   Albumin 3.3 (L) 3.5 - 5.0 g/dL   AST 27 15 - 41 U/L   ALT 24 0 - 44 U/L   Alkaline Phosphatase 40 38 - 126 U/L   Total Bilirubin 0.7 0.3 - 1.2 mg/dL   GFR, Estimated >60 >60 mL/min    Comment: (NOTE) Calculated using the CKD-EPI Creatinine Equation (2021)    Anion gap 5 5 - 15    Comment: Performed at [facility], [address], [place], [place] [address]  Uric acid     Status: None   Collection Time: 08/11/21  5:41 PM  Result Value Ref Range   Uric Acid, Serum 8.4 3.7 - 8.6 mg/dL    Comment: Performed at [facility], [address] [place], [place] [address]  CBG monitoring, ED     Status: Abnormal   Collection Time: 08/11/21 11:13 PM  Result Value Ref Range   Glucose-Capillary 167 (H) 70 - 99 mg/dL    Comment: Glucose reference range applies only to samples taken after fasting for at least 8 hours.  HIV Antibody (routine testing w rflx)     Status: None   Collection Time: 08/11/21 11:40 PM  Result Value Ref Range   HIV Screen 4th Generation wRfx Non Reactive Non Reactive    Comment: Performed at [facility], [place] [address], [place], [place] [address]  Basic metabolic panel     Status: Abnormal   Collection Time: 08/12/21  6:23 AM  Result Value Ref Range   Sodium 134 (L) 135 - 145 mmol/L   Potassium 4.6 3.5 - 5.1 mmol/L    Comment: HEMOLYSIS AT THIS LEVEL MAY AFFECT RESULT   Chloride 102 98 - 111 mmol/L   CO2 28 22 - 32 mmol/L   Glucose, Bld 149 (H) 70 - 99 mg/dL    Comment: Glucose reference range applies only to samples taken after fasting for at least 8 hours.   BUN 17 6 - 20 mg/dL   Creatinine, Ser 1.07 0.61 - 1.24 mg/dL   Calcium 8.5 (L) 8.9 - 10.3 mg/dL   GFR, Estimated >60 >60 mL/min    Comment: (NOTE) Calculated using the CKD-EPI Creatinine Equation (2021)    Anion gap 4 (L) 5 - 15    Comment: Performed at [facility], [address] [place], [place] [address]  CBC     Status: Abnormal   Collection Time: 08/12/21  6:23 AM  Result Value Ref Range   WBC 9.4 4.0 - 10.5 K/uL   RBC 5.54 4.22 - 5.81 MIL/uL   Hemoglobin 14.4 13.0 - 17.0 g/dL   HCT 45.1 39.0 - 52.0 %   MCV 81.4 80.0 - 100.0 fL   MCH 26.0 26.0 - 34.0 pg   MCHC 31.9 30.0 - 36.0 g/dL   RDW 17.2 (H) 11.5 - 15.5 %   Platelets 242 150 - 400 K/uL   nRBC 0.0 0.0 - 0.2 %    Comment: Performed at  [facility], [address], [place], [place] [address]  Resp Panel by RT-PCR (Flu A&B, Covid) Nasopharyngeal Swab     Status: None   Collection Time: 08/12/21  7:53 AM   Specimen:  Nasopharyngeal Swab; Nasopharyngeal(NP) swabs in vial transport medium  Result Value Ref Range   SARS Coronavirus 2 by RT PCR NEGATIVE NEGATIVE    Comment: (NOTE) SARS-CoV-2 target nucleic acids are NOT DETECTED.  The SARS-CoV-2 RNA is generally detectable in upper respiratory specimens during the acute phase of infection. The lowest concentration of SARS-CoV-2 viral copies this assay can detect is 138 copies/mL. A negative result does not preclude SARS-Cov-2 infection and should not be used as the sole basis for treatment or other patient management decisions. A negative result may occur with  improper specimen collection/handling, submission of specimen other than nasopharyngeal swab, presence of viral mutation(s) within the areas targeted by this assay, and inadequate number of viral copies(<138 copies/mL). A negative result must be combined with clinical observations, patient history, and epidemiological information. The expected result is Negative.  Fact Sheet for Patients:  EntrepreneurPulse.com.au  Fact Sheet for Healthcare Providers:  IncredibleEmployment.be  This test is no t yet approved or cleared by the [place] FDA and  has been authorized for detection and/or diagnosis of SARS-CoV-2 by FDA under an Emergency Use Authorization (EUA). This EUA will remain  in effect (meaning this test can be used) for the duration of the COVID-19 declaration under Section 564(b)(1) of the Act, 21 U.S.C.section 360bbb-3(b)(1), unless the authorization is terminated  or revoked sooner.       Influenza A by PCR NEGATIVE NEGATIVE   Influenza B by PCR NEGATIVE NEGATIVE    Comment: (NOTE) The Xpert Xpress SARS-CoV-2/FLU/RSV plus assay is intended as an aid in the diagnosis of influenza from Nasopharyngeal swab specimens and should not be used as a sole basis for treatment. Nasal washings and aspirates are unacceptable for Xpert Xpress  SARS-CoV-2/FLU/RSV testing.  Fact Sheet for Patients: EntrepreneurPulse.com.au  Fact Sheet for Healthcare Providers: IncredibleEmployment.be  This test is not yet approved or cleared by the [place] FDA and has been authorized for detection and/or diagnosis of SARS-CoV-2 by FDA under an Emergency Use Authorization (EUA). This EUA will remain in effect (meaning this test can be used) for the duration of the COVID-19 declaration under Section 564(b)(1) of the Act, 21 U.S.C. section 360bbb-3(b)(1), unless the authorization is terminated or revoked.  Performed at [facility], [address] [place], [place] [address]   CBG monitoring, ED     Status: Abnormal   Collection Time: 08/12/21  8:02 AM  Result Value Ref Range   Glucose-Capillary 120 (H) 70 - 99 mg/dL    Comment: Glucose reference range applies only to samples taken after fasting for at least 8 hours.   Comment 1 Notify RN    Comment 2 Document in Chart   CBG monitoring, ED     Status: Abnormal   Collection Time: 08/12/21 11:31 AM  Result Value Ref Range   Glucose-Capillary 158 (H) 70 - 99 mg/dL    Comment: Glucose reference range applies only to samples taken after fasting for at least 8 hours.   Comment 1 Notify RN    Comment 2 Document in Chart    DG Hand Complete Left  Result Date: 08/11/2021 CLINICAL DATA:  Left arm pain, infection. Redness and swelling in his left hand that began approximately 1 week prior. Denies any injury. Patient unable to straighten  his fingers. EXAM: LEFT HAND - COMPLETE 3+ VIEW COMPARISON:  None. FINDINGS: There is no evidence of fracture or dislocation. No cortical erosion or periosteal reaction. There is no evidence of arthropathy or other focal bone abnormality. There is marked subcutaneous soft tissue swelling prominent about the dorsum of the hand. No radiopaque foreign body. IMPRESSION: 1. Marked subcutaneous soft tissue edema prominent  about the dorsum of the hand consistent with cellulitis without evidence of radiopaque foreign body. 2. No cortical erosion or periosteal reaction concerning for osteomyelitis. Electronically Signed   By: Keane Police D.O.   On: 08/11/2021 18:15    Pending Labs Unresulted Labs (From admission, onward)     Start     Ordered   08/18/21 0500  Creatinine, serum  (enoxaparin (LOVENOX)    CrCl >/= 30 ml/min)  Weekly,   STAT     Comments: while on enoxaparin therapy    08/11/21 2057   08/12/21 1234  ESR  Once,   STAT        08/12/21 1233   08/11/21 2148  Hemoglobin A1c  Once,   STAT       Comments: To assess prior glycemic control    08/11/21 2148   08/11/21 1736  Culture, blood (routine x 2)  BLOOD CULTURE X 2,   STAT      08/11/21 1736            Vitals/Pain Today's Vitals   08/12/21 0444 08/12/21 0524 08/12/21 0739 08/12/21 1137  BP:   (!) 140/100 (!) 145/99  Pulse:   85 90  Resp:   18 18  Temp:   97.8 F (36.6 C)   TempSrc:   Oral   SpO2:   100% 96%  Weight:      Height:      PainSc: 10-Worst pain ever 8  5      Isolation Precautions No active isolations  Medications Medications  enoxaparin (LOVENOX) injection 70 mg (70 mg Subcutaneous Given 08/11/21 2217)  0.9 %  sodium chloride infusion ( Intravenous New Bag/Given 08/12/21 1117)  acetaminophen (TYLENOL) tablet 650 mg (has no administration in time range)    Or  acetaminophen (TYLENOL) suppository 650 mg (has no administration in time range)  traZODone (DESYREL) tablet 25 mg (has no administration in time range)  ondansetron (ZOFRAN) tablet 4 mg ( Oral See Alternative 08/12/21 0157)    Or  ondansetron (ZOFRAN) injection 4 mg (4 mg Intravenous Given 08/12/21 0157)  morphine 2 MG/ML injection 2 mg (2 mg Intravenous Given 08/12/21 0451)  ketorolac (TORADOL) 30 MG/ML injection 15 mg (15 mg Intravenous Given 08/12/21 0450)  ceFEPIme (MAXIPIME) 2 g in sodium chloride 0.9 % 100 mL IVPB (0 g Intravenous Stopped 08/12/21  0651)  losartan (COZAAR) tablet 50 mg (50 mg Oral Given 08/12/21 1006)  insulin aspart (novoLOG) injection 0-9 Units (2 Units Subcutaneous Given 08/12/21 1143)  vancomycin (VANCOREADY) IVPB 1500 mg/300 mL (1,500 mg Intravenous New Bag/Given 08/12/21 1209)  oxyCODONE-acetaminophen (PERCOCET/ROXICET) 5-325 MG per tablet 1 tablet (1 tablet Oral Given 08/11/21 2022)  vancomycin (VANCOCIN) IVPB 1000 mg/200 mL premix (0 mg Intravenous Stopped 08/11/21 2315)    Mobility walks Low fall risk   Focused Assessments Cardiac Assessment Handoff:    Lab Results  Component Value Date   TROPONINI <0.03 03/26/2018   No results found for: DDIMER Does the Patient currently have chest pain? No    R Recommendations: See Admitting Provider Note  Report given to:   Additional  Notes:

## 2021-08-12 NOTE — ED Notes (Signed)
Warm compress placed on left hand,per provider.

## 2021-08-12 NOTE — Progress Notes (Signed)
PROGRESS NOTE    Carlos Hall.  ZOX:[id number] DOB: Jun 01, 1976 DOA: 08/11/2021 PCP: [facility]    Brief Narrative:  45 year old with history of type 2 diabetes on oral hypoglycemics, sickle cell anemia presented with acute onset of left hand swelling, erythema and tenderness without any preceding symptoms.  Patient reported some pain on his MCP of the index finger since 1 week.  Patient is a Psychologist, clinical.   Assessment & Plan:   Principal Problem:   Cellulitis of left hand  Left hand and dorsum cellulitis: Significant swelling and deformity of the dorsum of the hand along with fingers.  No clinical evidence of underlying abscess. Continue on vancomycin and cefepime given history of diabetes.  Warm compress.  Elevate affected part. Patient may need operative intervention or MRI, I will discuss with orthopedics before proceeding with imaging studies.  Adequate pain medications.  Type 2 diabetes, well controlled on oral regimen at home.  Currently on sliding scale insulin.  Essential hypertension: On Cozaar.  Continued.  Well-controlled.   DVT prophylaxis:   Lovenox subcu   Code Status: Full code Family Communication: None Disposition Plan: Status is: Inpatient  Remains inpatient appropriate because: Significant hand swelling.         Consultants:  Orthopedics  Procedures:  None  Antimicrobials:  Vancomycin and cefepime 12/21   Subjective: Patient seen and examined.  Was still in the emergency room.  No change in his swelling of the hands.  Denies any fever or chills. 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    Objective: Vitals:   08/12/21 0155 08/12/21 0347 08/12/21 0739 08/12/21 1137  BP: (!) 150/101 (!) 153/99 (!) 140/100 (!) 145/99  Pulse:  (!) 102 85 90  Resp: 20 18 18 18   Temp: 98.3 F (36.8 C) 98.4 F (36.9 C) 97.8 F (36.6 C)   TempSrc:  Oral Oral   SpO2:  95% 100%  96%  Weight:      Height:       No intake or output data in the 24 hours ending 08/12/21 1225 Filed Weights   08/11/21 1738  Weight: (!) 145.2 kg    Examination:  General: Looks fairly comfortable.  On room air. Cardiovascular: S1-S2 normal.  Regular rate rhythm. Respiratory: Bilateral clear.  No added sounds. Gastrointestinal: Soft and nontender. Ext:  Left arm with diffuse swelling most prominent on the dorsum of the hand, no obvious fluctuation.  Tense and tender fingers.     Data Reviewed: I have personally reviewed following labs and imaging studies  CBC: Recent Labs  Lab 08/11/21 1741 08/12/21 0623  WBC 10.2 9.4  NEUTROABS 8.0*  --   HGB 14.3 14.4  HCT 44.7 45.1  MCV 80.4 81.4  PLT 266 242   Basic Metabolic Panel: Recent Labs  Lab 08/11/21 1741 08/12/21 0623  NA 136 134*  K 4.3 4.6  CL 103 102  CO2 28 28  GLUCOSE 152* 149*  BUN 16 17  CREATININE 0.91 1.07  CALCIUM 8.8* 8.5*   GFR: Estimated Creatinine Clearance: 127.4 mL/min (by C-G formula based on SCr of 1.07 mg/dL). Liver Function Tests: Recent Labs  Lab 08/11/21 1741  AST 27  ALT 24  ALKPHOS 40  BILITOT 0.7  PROT 7.0  ALBUMIN 3.3*   No results for input(s): LIPASE, AMYLASE in the last 168 hours. No results for input(s): AMMONIA in the last 168 hours. Coagulation Profile: No results for input(s):  INR, PROTIME in the last 168 hours. Cardiac Enzymes: No results for input(s): CKTOTAL, CKMB, CKMBINDEX, TROPONINI in the last 168 hours. BNP (last 3 results) No results for input(s): PROBNP in the last 8760 hours. HbA1C: No results for input(s): HGBA1C in the last 72 hours. CBG: Recent Labs  Lab 08/11/21 2313 08/12/21 0802 08/12/21 1131  GLUCAP 167* 120* 158*   Lipid Profile: No results for input(s): CHOL, HDL, LDLCALC, TRIG, CHOLHDL, LDLDIRECT in the last 72 hours. Thyroid Function Tests: No results for input(s): TSH, T4TOTAL, FREET4, T3FREE, THYROIDAB in the last 72 hours. Anemia  Panel: No results for input(s): VITAMINB12, FOLATE, FERRITIN, TIBC, IRON, RETICCTPCT in the last 72 hours. Sepsis Labs: No results for input(s): PROCALCITON, LATICACIDVEN in the last 168 hours.  Recent Results (from the past 240 hour(s))  Resp Panel by RT-PCR (Flu A&B, Covid) Nasopharyngeal Swab     Status: None   Collection Time: 08/12/21  7:53 AM   Specimen: Nasopharyngeal Swab; Nasopharyngeal(NP) swabs in vial transport medium  Result Value Ref Range Status   SARS Coronavirus 2 by RT PCR NEGATIVE NEGATIVE Final    Comment: (NOTE) SARS-CoV-2 target nucleic acids are NOT DETECTED.  The SARS-CoV-2 RNA is generally detectable in upper respiratory specimens during the acute phase of infection. The lowest concentration of SARS-CoV-2 viral copies this assay can detect is 138 copies/mL. A negative result does not preclude SARS-Cov-2 infection and should not be used as the sole basis for treatment or other patient management decisions. A negative result may occur with  improper specimen collection/handling, submission of specimen other than nasopharyngeal swab, presence of viral mutation(s) within the areas targeted by this assay, and inadequate number of viral copies(<138 copies/mL). A negative result must be combined with clinical observations, patient history, and epidemiological information. The expected result is Negative.  Fact Sheet for Patients:  BloggerCourse.com  Fact Sheet for Healthcare Providers:  SeriousBroker.it  This test is no t yet approved or cleared by the [place] FDA and  has been authorized for detection and/or diagnosis of SARS-CoV-2 by FDA under an Emergency Use Authorization (EUA). This EUA will remain  in effect (meaning this test can be used) for the duration of the COVID-19 declaration under Section 564(b)(1) of the Act, 21 U.S.C.section 360bbb-3(b)(1), unless the authorization is terminated  or  revoked sooner.       Influenza A by PCR NEGATIVE NEGATIVE Final   Influenza B by PCR NEGATIVE NEGATIVE Final    Comment: (NOTE) The Xpert Xpress SARS-CoV-2/FLU/RSV plus assay is intended as an aid in the diagnosis of influenza from Nasopharyngeal swab specimens and should not be used as a sole basis for treatment. Nasal washings and aspirates are unacceptable for Xpert Xpress SARS-CoV-2/FLU/RSV testing.  Fact Sheet for Patients: BloggerCourse.com  Fact Sheet for Healthcare Providers: SeriousBroker.it  This test is not yet approved or cleared by the [place] FDA and has been authorized for detection and/or diagnosis of SARS-CoV-2 by FDA under an Emergency Use Authorization (EUA). This EUA will remain in effect (meaning this test can be used) for the duration of the COVID-19 declaration under Section 564(b)(1) of the Act, 21 U.S.C. section 360bbb-3(b)(1), unless the authorization is terminated or revoked.  Performed at [facility], [address], [place], [place] [address]          Radiology Studies: DG Hand Complete Left  Result Date: 08/11/2021 CLINICAL DATA:  Left arm pain, infection. Redness and swelling in his left hand that began approximately 1 week  prior. Denies any injury. Patient unable to straighten his fingers. EXAM: LEFT HAND - COMPLETE 3+ VIEW COMPARISON:  None. FINDINGS: There is no evidence of fracture or dislocation. No cortical erosion or periosteal reaction. There is no evidence of arthropathy or other focal bone abnormality. There is marked subcutaneous soft tissue swelling prominent about the dorsum of the hand. No radiopaque foreign body. IMPRESSION: 1. Marked subcutaneous soft tissue edema prominent about the dorsum of the hand consistent with cellulitis without evidence of radiopaque foreign body. 2. No cortical erosion or periosteal reaction concerning for osteomyelitis. Electronically  Signed   By: Larose Hires D.O.   On: 08/11/2021 18:15        Scheduled Meds:  enoxaparin (LOVENOX) injection  70 mg Subcutaneous QHS   insulin aspart  0-9 Units Subcutaneous TID AC & HS   losartan  50 mg Oral Daily   Continuous Infusions:  sodium chloride 75 mL/hr at 08/12/21 1117   ceFEPime (MAXIPIME) IV Stopped (08/12/21 0160)   vancomycin 1,500 mg (08/12/21 1209)     LOS: 1 day    Time spent: 30 minutes    Dorcas Carrow, MD [facility] Pager [phone]

## 2021-08-12 NOTE — Consult Note (Signed)
Reason for Consult: Left hand swelling Referring Physician: Dr. Jalene Mullet. is an 45 y.o. male.  HPI: Patient is a 45 year old left-hand-dominant diabetic who has had swelling started about a week ago on the left hand.  Came in the emergency room yesterday and was noted to have extensive swelling of the hand x-rays show this consistent with cellulitis.  He does not know of any injury.  It has had progressive swelling with some pain.  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 He has never used smokeless tobacco. He reports current alcohol use. He reports current drug use. Drug: Marijuana.  Allergies: No Known Allergies  Medications: I have reviewed the patient's current medications.  Results for orders placed or performed during the hospital encounter of 08/11/21 (from the past 48 hour(s))  CBC with Differential     Status: Abnormal   Collection Time: 08/11/21  5:41 PM  Result Value Ref Range   WBC 10.2 4.0 - 10.5 K/uL   RBC 5.56 4.22 - 5.81 MIL/uL   Hemoglobin 14.3 13.0 - 17.0 g/dL   HCT 16.1 09.6 - 04.5 %   MCV 80.4 80.0 - 100.0 fL   MCH 25.7 (L) 26.0 - 34.0 pg   MCHC 32.0 30.0 - 36.0 g/dL   RDW 40.9 (H) 81.1 - 91.4 %   Platelets 266 150 - 400 K/uL   nRBC 0.0 0.0 - 0.2 %   Neutrophils Relative % 78 %   Neutro Abs 8.0 (H) 1.7 - 7.7 K/uL   Lymphocytes Relative 14 %   Lymphs Abs 1.4 0.7 - 4.0 K/uL   Monocytes Relative 5 %   Monocytes Absolute 0.5 0.1 - 1.0 K/uL   Eosinophils Relative 2 %   Eosinophils Absolute 0.2 0.0 - 0.5 K/uL   Basophils Relative 0 %   Basophils Absolute 0.0 0.0 - 0.1 K/uL    Immature Granulocytes 1 %   Abs Immature Granulocytes 0.07 0.00 - 0.07 K/uL    Comment: Performed at [facility], [address] [place], [place] [address]  Comprehensive metabolic panel     Status: Abnormal   Collection Time: 08/11/21  5:41 PM  Result Value Ref Range   Sodium 136 135 - 145 mmol/L   Potassium 4.3 3.5 - 5.1 mmol/L   Chloride 103 98 - 111 mmol/L   CO2 28 22 - 32 mmol/L   Glucose, Bld 152 (H) 70 - 99 mg/dL    Comment: Glucose reference range applies only to samples taken after fasting for at least 8 hours.   BUN 16 6 - 20 mg/dL   Creatinine, Ser 6.21 0.61 - 1.24 mg/dL   Calcium 8.8 (L) 8.9 - 10.3 mg/dL   Total Protein 7.0 6.5 - 8.1 g/dL   Albumin 3.3 (L) 3.5 - 5.0 g/dL   AST 27 15 - 41 U/L   ALT 24 0 - 44 U/L   Alkaline Phosphatase 40 38 - 126 U/L   Total Bilirubin 0.7 0.3 -  1.2 mg/dL   GFR, Estimated >82 >99 mL/min    Comment: (NOTE) Calculated using the CKD-EPI Creatinine Equation (2021)    Anion gap 5 5 - 15    Comment: Performed at [facility], [address] [place], [place] [address]  Uric acid     Status: None   Collection Time: 08/11/21  5:41 PM  Result Value Ref Range   Uric Acid, Serum 8.4 3.7 - 8.6 mg/dL    Comment: Performed at [facility], [address] [place], [place] [address]  CBG monitoring, ED     Status: Abnormal   Collection Time: 08/11/21 11:13 PM  Result Value Ref Range   Glucose-Capillary 167 (H) 70 - 99 mg/dL    Comment: Glucose reference range applies only to samples taken after fasting for at least 8 hours.  HIV Antibody (routine testing w rflx)     Status: None   Collection Time: 08/11/21 11:40 PM  Result Value Ref Range   HIV Screen 4th Generation wRfx Non Reactive Non Reactive    Comment: Performed at [facility], [address] [place], [place] [address]  Basic metabolic panel     Status: Abnormal   Collection Time: 08/12/21  6:23 AM  Result Value Ref Range   Sodium 134 (L) 135 -  145 mmol/L   Potassium 4.6 3.5 - 5.1 mmol/L    Comment: HEMOLYSIS AT THIS LEVEL MAY AFFECT RESULT   Chloride 102 98 - 111 mmol/L   CO2 28 22 - 32 mmol/L   Glucose, Bld 149 (H) 70 - 99 mg/dL    Comment: Glucose reference range applies only to samples taken after fasting for at least 8 hours.   BUN 17 6 - 20 mg/dL   Creatinine, Ser 5.10 0.61 - 1.24 mg/dL   Calcium 8.5 (L) 8.9 - 10.3 mg/dL   GFR, Estimated >25 >85 mL/min    Comment: (NOTE) Calculated using the CKD-EPI Creatinine Equation (2021)    Anion gap 4 (L) 5 - 15    Comment: Performed at [facility], [address] [place], [place] [address]  CBC     Status: Abnormal   Collection Time: 08/12/21  6:23 AM  Result Value Ref Range   WBC 9.4 4.0 - 10.5 K/uL   RBC 5.54 4.22 - 5.81 MIL/uL   Hemoglobin 14.4 13.0 - 17.0 g/dL   HCT 42.3 53.6 - 14.4 %   MCV 81.4 80.0 - 100.0 fL   MCH 26.0 26.0 - 34.0 pg   MCHC 31.9 30.0 - 36.0 g/dL   RDW 31.5 (H) 40.0 - 86.7 %   Platelets 242 150 - 400 K/uL   nRBC 0.0 0.0 - 0.2 %    Comment: Performed at [facility], [address] [place], [place] [address]  Resp Panel by RT-PCR (Flu A&B, Covid) Nasopharyngeal Swab     Status: None   Collection Time: 08/12/21  7:53 AM   Specimen: Nasopharyngeal Swab; Nasopharyngeal(NP) swabs in vial transport medium  Result Value Ref Range   SARS Coronavirus 2 by RT PCR NEGATIVE NEGATIVE    Comment: (NOTE) SARS-CoV-2 target nucleic acids are NOT DETECTED.  The SARS-CoV-2 RNA is generally detectable in upper respiratory specimens during the acute phase of infection. The lowest concentration of SARS-CoV-2 viral copies this assay can detect is 138 copies/mL. A negative result does not preclude SARS-Cov-2 infection and should not be used as the sole basis for treatment or other patient management decisions.  A negative result may occur with  improper specimen collection/handling, submission of specimen other than nasopharyngeal swab, presence  of viral mutation(s) within the areas targeted by this assay, and inadequate number of viral copies(<138 copies/mL). A negative result must be combined with clinical observations, patient history, and epidemiological information. The expected result is Negative.  Fact Sheet for Patients:  BloggerCourse.com  Fact Sheet for Healthcare Providers:  SeriousBroker.it  This test is no t yet approved or cleared by the [place] FDA and  has been authorized for detection and/or diagnosis of SARS-CoV-2 by FDA under an Emergency Use Authorization (EUA). This EUA will remain  in effect (meaning this test can be used) for the duration of the COVID-19 declaration under Section 564(b)(1) of the Act, 21 U.S.C.section 360bbb-3(b)(1), unless the authorization is terminated  or revoked sooner.       Influenza A by PCR NEGATIVE NEGATIVE   Influenza B by PCR NEGATIVE NEGATIVE    Comment: (NOTE) The Xpert Xpress SARS-CoV-2/FLU/RSV plus assay is intended as an aid in the diagnosis of influenza from Nasopharyngeal swab specimens and should not be used as a sole basis for treatment. Nasal washings and aspirates are unacceptable for Xpert Xpress SARS-CoV-2/FLU/RSV testing.  Fact Sheet for Patients: BloggerCourse.com  Fact Sheet for Healthcare Providers: SeriousBroker.it  This test is not yet approved or cleared by the [place] FDA and has been authorized for detection and/or diagnosis of SARS-CoV-2 by FDA under an Emergency Use Authorization (EUA). This EUA will remain in effect (meaning this test can be used) for the duration of the COVID-19 declaration under Section 564(b)(1) of the Act, 21 U.S.C. section 360bbb-3(b)(1), unless the authorization is terminated or revoked.  Performed at [facility], [address] [place], [place] [address]   CBG monitoring, ED     Status:  Abnormal   Collection Time: 08/12/21  8:02 AM  Result Value Ref Range   Glucose-Capillary 120 (H) 70 - 99 mg/dL    Comment: Glucose reference range applies only to samples taken after fasting for at least 8 hours.   Comment 1 Notify RN    Comment 2 Document in Chart   CBG monitoring, ED     Status: Abnormal   Collection Time: 08/12/21 11:31 AM  Result Value Ref Range   Glucose-Capillary 158 (H) 70 - 99 mg/dL    Comment: Glucose reference range applies only to samples taken after fasting for at least 8 hours.   Comment 1 Notify RN    Comment 2 Document in Chart     DG Hand Complete Left  Result Date: 08/11/2021 CLINICAL DATA:  Left arm pain, infection. Redness and swelling in his left hand that began approximately 1 week prior. Denies any injury. Patient unable to straighten his fingers. EXAM: LEFT HAND - COMPLETE 3+ VIEW COMPARISON:  None. FINDINGS: There is no evidence of fracture or dislocation. No cortical erosion or periosteal reaction. There is no evidence of arthropathy or other focal bone abnormality. There is marked subcutaneous soft tissue swelling prominent about the dorsum of the hand. No radiopaque foreign body. IMPRESSION: 1. Marked subcutaneous soft tissue edema prominent about the dorsum of the hand consistent with cellulitis without evidence of radiopaque foreign body. 2. No cortical erosion or periosteal reaction concerning for osteomyelitis. Electronically Signed   By: Larose Hires D.O.   On: 08/11/2021 18:15    Review of Systems Blood pressure (!) 145/99, pulse 90, temperature 97.8 F (36.6 C), temperature source Oral, resp. rate 18,  height 5\' 11"  (1.803 m), weight (!) 145.2 kg, SpO2 96 %. Physical Exam On exam and there are negative Kanavel signs and to palpation he has extensive swelling on the dorsum and volar aspect of the hand without point tenderness.  It appears to be diffuse and in the soft tissues not really in the flexor tendon or extensor tendons.  He holds the  fingers in slight flexion secondary to this edema.  Passive motion is not particularly painful.  He is nontender above the wrist swelling starts at the level of the wrist. Assessment/Plan: Left hand swelling unknown etiology.  I did not certain that there is infection and recommend and have ordered a venous Doppler for the left upper extremity to rule out DVT.  Also sed rate ordered as he had 1 year ago that was 17.  That should be good check to see if infection or extensive inflammation present.  Will recheck later today.  Ace wrap wrapped around the hand to decrease edema.  08/12/2021, 12:26 PM

## 2021-08-12 NOTE — Progress Notes (Signed)
ESR normal and Doppler negative for DVT. Hand swelling decreased with compression with ACE wrap. No lymphadenopathy in axilla. Uncertain of cause of his edema, but improving.

## 2021-08-13 DIAGNOSIS — L03114 Cellulitis of left upper limb: Secondary | ICD-10-CM | POA: Diagnosis not present

## 2021-08-13 LAB — HEMOGLOBIN A1C
Hgb A1c MFr Bld: 7.5 % — ABNORMAL HIGH (ref 4.8–5.6)
Mean Plasma Glucose: 169 mg/dL

## 2021-08-13 LAB — GLUCOSE, CAPILLARY
Glucose-Capillary: 98 mg/dL (ref 70–99)
Glucose-Capillary: 113 mg/dL — ABNORMAL HIGH (ref 70–99)

## 2021-08-13 MED ORDER — CIPROFLOXACIN HCL 500 MG PO TABS: 500.0000 mg | ORAL_TABLET | Freq: Two times a day (BID) | ORAL | 0 refills | Status: AC

## 2021-08-13 NOTE — Progress Notes (Signed)
Swelling the hand is diminished about 50% of what it was yesterday with the use of an Ace wrap. 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  I doubt that is related to the current injury or problem.  I would recommend at this time hand therapy see him for compressive glove to decrease the edema again still not knowing what the underlying etiology is.  Consult to Occupational Therapy placed.

## 2021-08-13 NOTE — Progress Notes (Signed)
BP elevated. Order received from Dr Jerral Ralph to discontinue IVF

## 2021-08-13 NOTE — Progress Notes (Signed)
Discharge instructions reviewed with the patient. Patient sent out via wheelchair to waiting ride. °
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
## 2021-08-13 NOTE — Discharge Summary (Signed)
Physician Discharge Summary  Carlos Hall. XHB:[id number] DOB: 20-Jun-1976 DOA: 08/11/2021  PCP: [facility] [place] date: 08/11/2021 Discharge date: 08/13/2021  Admitted From: Home Disposition: Home  Recommendations for Outpatient Follow-up:  Follow up with PCP in 1-2 weeks Follow-up with orthopedic surgery in 1 week  Home Health: N/A Equipment/Devices: N/A  Discharge Condition: Stable CODE STATUS: Full code Diet recommendation: Low-salt, low-carb diet  Discharge summary:  45 year old gentleman with history of type 2 diabetes on insulin, sickle cell anemia presented with acute onset of left hand swelling, erythema and tenderness without any preceding symptoms for about 1 week.  He is a Chief Executive Officer and left-hand-dominant.  In the emergency room hemodynamically stable.  He was found to have significant edema and swelling of the left hand and fingers and was admitted to the hospital treated with broad-spectrum IV antibiotics, hot compress and compression bandages.  Also seen by orthopedics. X-ray with no evidence of underlying bony destruction. ESR 7, uric acid 8.4.  Left upper extremity duplex is negative for DVT.  Improved with compression and elevation.  Blood cultures negative.  With adequate improvement, this was thought to be without any evidence of abscess or underlying bone infection.  Suggested symptomatic treatment. Ciprofloxacin 500 mg twice daily for 7 days Ace bandage with compression, elevation of the left arm and hot compress.  Orthopedics follow-up in 7 days.  Blood sugars are well controlled.  A1c 7.5.  He is on insulin at home that he will continue.  Blood pressures are well stabilized.    Discharge Diagnoses:  Principal Problem:   Cellulitis of left hand    Discharge Instructions  Discharge Instructions     Call MD for:  severe uncontrolled pain   Complete by: As directed    Call MD for:  temperature >100.4   Complete by:  As directed    Diet - low sodium heart healthy   Complete by: As directed    Diet Carb Modified   Complete by: As directed    Discharge instructions   Complete by: As directed    Keep ACE wrap on left hand and keep it elevated . Also can do hot compress.   Increase activity slowly   Complete by: As directed       Allergies as of 08/13/2021   No Known Allergies      Medication List     STOP taking these medications    terbinafine 250 MG tablet Commonly known as: LAMISIL       TAKE these medications    ciprofloxacin 500 MG tablet Commonly known as: Cipro Take 1 tablet (500 mg total) by mouth 2 (two) times daily for 7 days.   insulin NPH-regular Human (70-30) 100 UNIT/ML injection Inject 30 Units into the skin 2 (two) times daily with a meal.   losartan 50 MG tablet Commonly known as: COZAAR Take 50 mg by mouth daily.   sildenafil 100 MG tablet Commonly known as: VIAGRA Take 100 mg by mouth daily as needed.   testosterone cypionate 200 MG/ML injection Commonly known as: DEPOTESTOSTERONE CYPIONATE Inject 200 mg into the muscle every 14 (fourteen) days.   Trulicity 1.5 YB/0.1BP Sopn Generic drug: Dulaglutide Inject 1.5 mg into the skin once a week.        Follow-up Information     Hessie Knows, MD. Schedule an appointment as soon as possible for a visit in 1 week(s).   Specialty: Orthopedic Surgery Contact information: [address]  [place] [address] [phone]                No Known Allergies  Consultations: Orthopedics   Procedures/Studies: US Venous Img Upper Uni Left (DVT)  Result Date: 08/12/2021 CLINICAL DATA:  Left upper extremity pain and edema. EXAM: LEFT UPPER EXTREMITY VENOUS DOPPLER ULTRASOUND TECHNIQUE: Gray-scale sonography with graded compression, as well as color Doppler and duplex ultrasound were performed to evaluate the upper extremity deep venous system from the level of  the subclavian vein and including the jugular, axillary, basilic, radial, ulnar and upper cephalic vein. Spectral Doppler was utilized to evaluate flow at rest and with distal augmentation maneuvers. COMPARISON:  None. FINDINGS: Contralateral Subclavian Vein: Respiratory phasicity is normal and symmetric with the symptomatic side. No evidence of thrombus. Normal compressibility. Internal Jugular Vein: No evidence of thrombus. Normal compressibility, respiratory phasicity and response to augmentation. Subclavian Vein: No evidence of thrombus. Normal compressibility, respiratory phasicity and response to augmentation. Axillary Vein: No evidence of thrombus. Normal compressibility, respiratory phasicity and response to augmentation. Cephalic Vein: No evidence of thrombus. Normal compressibility, respiratory phasicity and response to augmentation. Basilic Vein: No evidence of thrombus. Normal compressibility, respiratory phasicity and response to augmentation. Brachial Veins: No evidence of thrombus. Normal compressibility, respiratory phasicity and response to augmentation. Radial Veins: No evidence of thrombus. Normal compressibility, respiratory phasicity and response to augmentation. Ulnar Veins: No evidence of thrombus. Normal compressibility, respiratory phasicity and response to augmentation. Venous Reflux:  None visualized. Other Findings: No evidence of superficial thrombophlebitis or abnormal fluid collection. IMPRESSION: No evidence of DVT within the left upper extremity. Electronically Signed   By: Aletta Edouard M.D.   On: 08/12/2021 14:11   DG Hand Complete Left  Result Date: 08/11/2021 CLINICAL DATA:  Left arm pain, infection. Redness and swelling in his left hand that began approximately 1 week prior. Denies any injury. Patient unable to straighten his fingers. EXAM: LEFT HAND - COMPLETE 3+ VIEW COMPARISON:  None. FINDINGS: There is no evidence of fracture or dislocation. No cortical erosion or  periosteal reaction. There is no evidence of arthropathy or other focal bone abnormality. There is marked subcutaneous soft tissue swelling prominent about the dorsum of the hand. No radiopaque foreign body. IMPRESSION: 1. Marked subcutaneous soft tissue edema prominent about the dorsum of the hand consistent with cellulitis without evidence of radiopaque foreign body. 2. No cortical erosion or periosteal reaction concerning for osteomyelitis. Electronically Signed   By: Keane Police D.O.   On: 08/11/2021 18:15   (Echo, Carotid, EGD, Colonoscopy, ERCP)    Subjective: Patient seen and examined in the morning rounds.  He is with mild discomfort on the left arm.  He is very eager to go home.  He thinks and is slightly better.  Afebrile overnight.   Discharge Exam: Vitals:   08/12/21 2135 08/13/21 0723  BP: (!) 154/99 (!) 162/100  Pulse: 81 79  Resp: 20 18  Temp: (!) 97.5 F (36.4 C) 97.9 F (36.6 C)  SpO2: 95% 96%   Vitals:   08/12/21 1330 08/12/21 1430 08/12/21 2135 08/13/21 0723  BP: (!) 144/99 137/90 (!) 154/99 (!) 162/100  Pulse: 92 84 81 79  Resp: _0 Temp: 97.8 F (36.6 C) (!) 97.5 F (36.4 C) (!) 97.5 F (36.4 C) 97.9 F (36.6 C)  TempSrc:   Oral   SpO2: 97% 97% 95% 96%  Weight:      Height:  General: Pt is alert, awake, not in acute distress, walking around in the hallway. Cardiovascular: RRR, S1/S2 +, no rubs, no gallops Respiratory: CTA bilaterally, no wheezing, no rhonchi Abdominal: Soft, NT, ND, bowel sounds + Extremities:  Pitting edema dorsum of the left hand with some edema of the fingers.  Capillary refill less than 2 seconds.  No induration or erythema.    The results of significant diagnostics from this hospitalization (including imaging, microbiology, ancillary and laboratory) are listed below for reference.     Microbiology: Recent Results (from the past 240 hour(s))  Culture, blood (routine x 2)     Status: None (Preliminary result)    Collection Time: 08/11/21  5:41 PM   Specimen: BLOOD  Result Value Ref Range Status   Specimen Description BLOOD LEFT ANTECUBITAL  Final   Special Requests   Final    BOTTLES DRAWN AEROBIC AND ANAEROBIC Blood Culture adequate volume   Culture   Final    NO GROWTH 2 DAYS Performed at [facility], [address] [place], [place] [address]    Report Status PENDING  Incomplete  Culture, blood (routine x 2)     Status: None (Preliminary result)   Collection Time: 08/11/21 11:40 PM   Specimen: BLOOD  Result Value Ref Range Status   Specimen Description BLOOD LEFT FOREARM  Final   Special Requests   Final    BOTTLES DRAWN AEROBIC AND ANAEROBIC Blood Culture results may not be optimal due to an inadequate volume of blood received in culture bottles   Culture   Final    NO GROWTH 1 DAY Performed at [facility], [address] [place],  [address]    Report Status PENDING  Incomplete  Resp Panel by RT-PCR (Flu A&B, Covid) Nasopharyngeal Swab     Status: None   Collection Time: 08/12/21  7:53 AM   Specimen: Nasopharyngeal Swab; Nasopharyngeal(NP) swabs in vial transport medium  Result Value Ref Range Status   SARS Coronavirus 2 by RT PCR NEGATIVE NEGATIVE Final    Comment: (NOTE) SARS-CoV-2 target nucleic acids are NOT DETECTED.  The SARS-CoV-2 RNA is generally detectable in upper respiratory specimens during the acute phase of infection. The lowest concentration of SARS-CoV-2 viral copies this assay can detect is 138 copies/mL. A negative result does not preclude SARS-Cov-2 infection and should not be used as the sole basis for treatment or other patient management decisions. A negative result may occur with  improper specimen collection/handling, submission of specimen other than nasopharyngeal swab, presence of viral mutation(s) within the areas targeted by this assay, and inadequate number of viral copies(<138 copies/mL). A negative result must be  combined with clinical observations, patient history, and epidemiological information. The expected result is Negative.  Fact Sheet for Patients:  EntrepreneurPulse.com.au  Fact Sheet for Healthcare Providers:  IncredibleEmployment.be  This test is no t yet approved or cleared by the [place] FDA and  has been authorized for detection and/or diagnosis of SARS-CoV-2 by FDA under an Emergency Use Authorization (EUA). This EUA will remain  in effect (meaning this test can be used) for the duration of the COVID-19 declaration under Section 564(b)(1) of the Act, 21 U.S.C.section 360bbb-3(b)(1), unless the authorization is terminated  or revoked sooner.       Influenza A by PCR NEGATIVE NEGATIVE Final   Influenza B by PCR NEGATIVE NEGATIVE Final    Comment: (NOTE) The Xpert Xpress SARS-CoV-2/FLU/RSV plus assay is intended as an aid in the diagnosis of  influenza from Nasopharyngeal swab specimens and should not be used as a sole basis for treatment. Nasal washings and aspirates are unacceptable for Xpert Xpress SARS-CoV-2/FLU/RSV testing.  Fact Sheet for Patients: EntrepreneurPulse.com.au  Fact Sheet for Healthcare Providers: IncredibleEmployment.be  This test is not yet approved or cleared by the [place] FDA and has been authorized for detection and/or diagnosis of SARS-CoV-2 by FDA under an Emergency Use Authorization (EUA). This EUA will remain in effect (meaning this test can be used) for the duration of the COVID-19 declaration under Section 564(b)(1) of the Act, 21 U.S.C. section 360bbb-3(b)(1), unless the authorization is terminated or revoked.  Performed at [facility], [address] [place], [place] [address]      Labs: BNP (last 3 results) No results for input(s): BNP in the last 8760 hours. Basic Metabolic Panel: Recent Labs  Lab 08/11/21 1741 08/12/21 0623  NA 136  134*  K 4.3 4.6  CL 103 102  CO2 28 28  GLUCOSE 152* 149*  BUN 16 17  CREATININE 0.91 1.07  CALCIUM 8.8* 8.5*   Liver Function Tests: Recent Labs  Lab 08/11/21 1741  AST 27  ALT 24  ALKPHOS 40  BILITOT 0.7  PROT 7.0  ALBUMIN 3.3*   No results for input(s): LIPASE, AMYLASE in the last 168 hours. No results for input(s): AMMONIA in the last 168 hours. CBC: Recent Labs  Lab 08/11/21 1741 08/12/21 0623  WBC 10.2 9.4  NEUTROABS 8.0*  --   HGB 14.3 14.4  HCT 44.7 45.1  MCV 80.4 81.4  PLT 266 242   Cardiac Enzymes: No results for input(s): CKTOTAL, CKMB, CKMBINDEX, TROPONINI in the last 168 hours. BNP: Invalid input(s): POCBNP CBG: Recent Labs  Lab 08/12/21 1131 08/12/21 1640 08/12/21 2055 08/13/21 0724 08/13/21 1145  GLUCAP 158* 112* 172* 98 113*   D-Dimer No results for input(s): DDIMER in the last 72 hours. Hgb A1c Recent Labs    08/11/21 1741  HGBA1C 7.5*   Lipid Profile No results for input(s): CHOL, HDL, LDLCALC, TRIG, CHOLHDL, LDLDIRECT in the last 72 hours. Thyroid function studies No results for input(s): TSH, T4TOTAL, T3FREE, THYROIDAB in the last 72 hours.  Invalid input(s): FREET3 Anemia work up No results for input(s): VITAMINB12, FOLATE, FERRITIN, TIBC, IRON, RETICCTPCT in the last 72 hours. Urinalysis    Component Value Date/Time   COLORURINE COLORLESS (A) 11/03/2017 2233   APPEARANCEUR CLEAR (A) 11/03/2017 2233   APPEARANCEUR Clear 12/01/2013 1514   LABSPEC 1.022 11/03/2017 2233   LABSPEC 1.020 12/01/2013 1514   PHURINE 6.0 11/03/2017 2233   GLUCOSEU >=500 (A) 11/03/2017 2233   GLUCOSEU >=500 12/01/2013 1514   HGBUR NEGATIVE 11/03/2017 2233   BILIRUBINUR NEGATIVE 11/03/2017 2233   BILIRUBINUR Negative 12/01/2013 [place] 11/03/2017 2233   PROTEINUR NEGATIVE 11/03/2017 2233   NITRITE NEGATIVE 11/03/2017 2233   LEUKOCYTESUR NEGATIVE 11/03/2017 2233   LEUKOCYTESUR Negative 12/01/2013 1514   Sepsis Labs Invalid  input(s): PROCALCITONIN,  WBC,  LACTICIDVEN Microbiology Recent Results (from the past 240 hour(s))  Culture, blood (routine x 2)     Status: None (Preliminary result)   Collection Time: 08/11/21  5:41 PM   Specimen: BLOOD  Result Value Ref Range Status   Specimen Description BLOOD LEFT ANTECUBITAL  Final   Special Requests   Final    BOTTLES DRAWN AEROBIC AND ANAEROBIC Blood Culture adequate volume   Culture   Final    NO GROWTH 2 DAYS Performed at [facility], [address] [place], [place] [address]    Report Status PENDING  Incomplete  Culture, blood (routine x 2)     Status: None (Preliminary result)   Collection Time: 08/11/21 11:40 PM   Specimen: BLOOD  Result Value Ref Range Status   Specimen Description BLOOD LEFT FOREARM  Final   Special Requests   Final    BOTTLES DRAWN AEROBIC AND ANAEROBIC Blood Culture results may not be optimal due to an inadequate volume of blood received in culture bottles   Culture   Final    NO GROWTH 1 DAY Performed at [facility], [address] [place], [place] [address]    Report Status PENDING  Incomplete  Resp Panel by RT-PCR (Flu A&B, Covid) Nasopharyngeal Swab     Status: None   Collection Time: 08/12/21  7:53 AM   Specimen: Nasopharyngeal Swab; Nasopharyngeal(NP) swabs in vial transport medium  Result Value Ref Range Status   SARS Coronavirus 2 by RT PCR NEGATIVE NEGATIVE Final    Comment: (NOTE) SARS-CoV-2 target nucleic acids are NOT DETECTED.  The SARS-CoV-2 RNA is generally detectable in upper respiratory specimens during the acute phase of infection. The lowest concentration of SARS-CoV-2 viral copies this assay can detect is 138 copies/mL. A negative result does not preclude SARS-Cov-2 infection and should not be used as the sole basis for treatment or other patient management decisions. A negative result may occur with  improper specimen collection/handling, submission of specimen other than  nasopharyngeal swab, presence of viral mutation(s) within the areas targeted by this assay, and inadequate number of viral copies(<138 copies/mL). A negative result must be combined with clinical observations, patient history, and epidemiological information. The expected result is Negative.  Fact Sheet for Patients:  EntrepreneurPulse.com.au  Fact Sheet for Healthcare Providers:  IncredibleEmployment.be  This test is no t yet approved or cleared by the [place] FDA and  has been authorized for detection and/or diagnosis of SARS-CoV-2 by FDA under an Emergency Use Authorization (EUA). This EUA will remain  in effect (meaning this test can be used) for the duration of the COVID-19 declaration under Section 564(b)(1) of the Act, 21 U.S.C.section 360bbb-3(b)(1), unless the authorization is terminated  or revoked sooner.       Influenza A by PCR NEGATIVE NEGATIVE Final   Influenza B by PCR NEGATIVE NEGATIVE Final    Comment: (NOTE) The Xpert Xpress SARS-CoV-2/FLU/RSV plus assay is intended as an aid in the diagnosis of influenza from Nasopharyngeal swab specimens and should not be used as a sole basis for treatment. Nasal washings and aspirates are unacceptable for Xpert Xpress SARS-CoV-2/FLU/RSV testing.  Fact Sheet for Patients: EntrepreneurPulse.com.au  Fact Sheet for Healthcare Providers: IncredibleEmployment.be  This test is not yet approved or cleared by the [place] FDA and has been authorized for detection and/or diagnosis of SARS-CoV-2 by FDA under an Emergency Use Authorization (EUA). This EUA will remain in effect (meaning this test can be used) for the duration of the COVID-19 declaration under Section 564(b)(1) of the Act, 21 U.S.C. section 360bbb-3(b)(1), unless the authorization is terminated or revoked.  Performed at [facility], [address] [place], [place]  [address]      Time coordinating discharge: 32 minutes  SIGNED:   Barb Merino, MD  [facility] 08/13/2021, 12:55 PM

## 2021-08-16 LAB — CULTURE, BLOOD (ROUTINE X 2)
Culture: NO GROWTH
Special Requests: ADEQUATE

## 2021-08-17 LAB — CULTURE, BLOOD (ROUTINE X 2): Culture: NO GROWTH

## 2021-08-26 ENCOUNTER — Other Ambulatory Visit: Payer: Self-pay

## 2021-08-26 ENCOUNTER — Encounter: Payer: Self-pay | Admitting: Emergency Medicine

## 2021-08-26 DIAGNOSIS — Z5321 Procedure and treatment not carried out due to patient leaving prior to being seen by health care provider: Secondary | ICD-10-CM | POA: Diagnosis not present

## 2021-08-26 DIAGNOSIS — M79642 Pain in left hand: Secondary | ICD-10-CM | POA: Diagnosis not present

## 2021-08-26 DIAGNOSIS — R2232 Localized swelling, mass and lump, left upper limb: Secondary | ICD-10-CM | POA: Insufficient documentation

## 2021-08-26 LAB — CBC WITH DIFFERENTIAL/PLATELET
Abs Immature Granulocytes: 0.09 10*3/uL — ABNORMAL HIGH (ref 0.00–0.07)
Basophils Absolute: 0 10*3/uL (ref 0.0–0.1)
Basophils Relative: 0 %
Eosinophils Absolute: 0.2 10*3/uL (ref 0.0–0.5)
Eosinophils Relative: 1 %
HCT: 47.1 % (ref 39.0–52.0)
Hemoglobin: 15 g/dL (ref 13.0–17.0)
Immature Granulocytes: 1 %
Lymphocytes Relative: 12 %
Lymphs Abs: 1.6 10*3/uL (ref 0.7–4.0)
MCH: 25.9 pg — ABNORMAL LOW (ref 26.0–34.0)
MCHC: 31.8 g/dL (ref 30.0–36.0)
MCV: 81.3 fL (ref 80.0–100.0)
Monocytes Absolute: 0.7 10*3/uL (ref 0.1–1.0)
Monocytes Relative: 5 %
Neutro Abs: 10.7 10*3/uL — ABNORMAL HIGH (ref 1.7–7.7)
Neutrophils Relative %: 81 %
Platelets: 328 10*3/uL (ref 150–400)
RBC: 5.79 MIL/uL (ref 4.22–5.81)
RDW: 17.2 % — ABNORMAL HIGH (ref 11.5–15.5)
WBC: 13.2 10*3/uL — ABNORMAL HIGH (ref 4.0–10.5)
nRBC: 0 % (ref 0.0–0.2)

## 2021-08-26 LAB — COMPREHENSIVE METABOLIC PANEL
ALT: 29 U/L (ref 0–44)
AST: 27 U/L (ref 15–41)
Albumin: 3.4 g/dL — ABNORMAL LOW (ref 3.5–5.0)
Alkaline Phosphatase: 45 U/L (ref 38–126)
Anion gap: 9 (ref 5–15)
BUN: 21 mg/dL — ABNORMAL HIGH (ref 6–20)
CO2: 31 mmol/L (ref 22–32)
Calcium: 8.5 mg/dL — ABNORMAL LOW (ref 8.9–10.3)
Chloride: 97 mmol/L — ABNORMAL LOW (ref 98–111)
Creatinine, Ser: 1.02 mg/dL (ref 0.61–1.24)
GFR, Estimated: >60 mL/min (ref >60)
Glucose, Bld: 72 mg/dL (ref 70–99)
Potassium: 4 mmol/L (ref 3.5–5.1)
Sodium: 137 mmol/L (ref 135–145)
Total Bilirubin: 0.9 mg/dL (ref 0.3–1.2)
Total Protein: 6.9 g/dL (ref 6.5–8.1)

## 2021-08-26 LAB — LACTIC ACID, PLASMA: Lactic Acid, Venous: 1.1 mmol/L (ref 0.5–1.9)

## 2021-08-26 NOTE — ED Provider Triage Note (Signed)
Emergency Medicine Provider Triage Evaluation Note  Carlos Hall , a 46 y.o. male  was evaluated in triage.  Pt complains of left hand pain, redness, and swelling. Completed antibiotics and steroids without any improvement. No fever. Left hand dominant.  Review of Systems  Positive: Left hand pain and swelling Negative: Fever  Physical Exam  BP (!) 153/110 (BP Location: Right Arm)    Pulse (!) 101    Temp 98 F (36.7 C) (Oral)    Resp 17    Ht 5\' 11"  (1.803 m)    Wt (!) 145.2 kg    SpO2 92%    BMI 44.63 kg/m  Gen:   Awake, no distress   Resp:  Normal effort  MSK:   Moves extremities without difficulty  Other:    Medical Decision Making  Medically screening exam initiated at 1:28 PM.  Appropriate orders placed.  . was informed that the remainder of the evaluation will be completed by another provider, this initial triage assessment does not replace that evaluation, and the importance of remaining in the ED until their evaluation is complete.    Carlos Rake, FNP 08/26/21 1329

## 2021-08-26 NOTE — ED Triage Notes (Signed)
Pt comes into the ED via POV c/o left hand swelling that started on 08/13/21 where he was seen here and given oral abx.  Pt then went to [facility] on 12/26 due to the swelling still not going down, and they gave him oral steroids.  Pt completed all antibiotics and steroids and still has significant swelling and pain in the left hand throughout all fingers.  Pt in NAD at this time.

## 2021-08-26 NOTE — ED Notes (Signed)
First set of cultures sent to lab with save labels on them.  Drawn from right forearm.

## 2021-08-27 ENCOUNTER — Emergency Department
Admission: EM | Admit: 2021-08-27 | Discharge: 2021-08-27 | Disposition: A | Discharge status: Home / Self Care | Payer: Medicaid Other | Attending: Emergency Medicine | Admitting: Emergency Medicine

## 2021-08-27 NOTE — ED Notes (Signed)
No answer when called from lobby 

## 2021-08-27 NOTE — ED Notes (Signed)
CALLED X2 FOR VITALS NO ANSWER

## 2022-01-27 DIAGNOSIS — D571 Sickle-cell disease without crisis: Secondary | ICD-10-CM | POA: Insufficient documentation

## 2022-02-19 ENCOUNTER — Other Ambulatory Visit: Payer: Self-pay

## 2022-02-19 ENCOUNTER — Encounter: Payer: Self-pay | Admitting: Emergency Medicine

## 2022-02-19 ENCOUNTER — Emergency Department: Payer: Medicaid Other

## 2022-02-19 ENCOUNTER — Emergency Department
Admission: EM | Admit: 2022-02-19 | Discharge: 2022-02-19 | Disposition: A | Discharge status: Home / Self Care | Payer: Medicaid Other | Attending: Emergency Medicine | Admitting: Emergency Medicine

## 2022-02-19 DIAGNOSIS — G43909 Migraine, unspecified, not intractable, without status migrainosus: Secondary | ICD-10-CM | POA: Diagnosis not present

## 2022-02-19 DIAGNOSIS — R519 Headache, unspecified: Secondary | ICD-10-CM | POA: Diagnosis present

## 2022-02-19 DIAGNOSIS — I10 Essential (primary) hypertension: Secondary | ICD-10-CM | POA: Insufficient documentation

## 2022-02-19 LAB — CBC WITH DIFFERENTIAL/PLATELET
Abs Immature Granulocytes: 0.06 10*3/uL (ref 0.00–0.07)
Basophils Absolute: 0 10*3/uL (ref 0.0–0.1)
Basophils Relative: 0 %
Eosinophils Absolute: 0.1 10*3/uL (ref 0.0–0.5)
Eosinophils Relative: 1 %
HCT: 51.8 % (ref 39.0–52.0)
Hemoglobin: 16 g/dL (ref 13.0–17.0)
Immature Granulocytes: 0 %
Lymphocytes Relative: 11 %
Lymphs Abs: 1.5 10*3/uL (ref 0.7–4.0)
MCH: 24.7 pg — ABNORMAL LOW (ref 26.0–34.0)
MCHC: 30.9 g/dL (ref 30.0–36.0)
MCV: 79.8 fL — ABNORMAL LOW (ref 80.0–100.0)
Monocytes Absolute: 0.7 10*3/uL (ref 0.1–1.0)
Monocytes Relative: 5 %
Neutro Abs: 11.4 10*3/uL — ABNORMAL HIGH (ref 1.7–7.7)
Neutrophils Relative %: 83 %
Platelets: 259 10*3/uL (ref 150–400)
RBC: 6.49 MIL/uL — ABNORMAL HIGH (ref 4.22–5.81)
RDW: 19.6 % — ABNORMAL HIGH (ref 11.5–15.5)
WBC: 13.7 10*3/uL — ABNORMAL HIGH (ref 4.0–10.5)
nRBC: 0 % (ref 0.0–0.2)

## 2022-02-19 LAB — BASIC METABOLIC PANEL
Anion gap: 9 (ref 5–15)
BUN: 15 mg/dL (ref 6–20)
CO2: 29 mmol/L (ref 22–32)
Calcium: 9 mg/dL (ref 8.9–10.3)
Chloride: 99 mmol/L (ref 98–111)
Creatinine, Ser: 1.29 mg/dL — ABNORMAL HIGH (ref 0.61–1.24)
GFR, Estimated: >60 mL/min (ref >60)
Glucose, Bld: 112 mg/dL — ABNORMAL HIGH (ref 70–99)
Potassium: 4.2 mmol/L (ref 3.5–5.1)
Sodium: 137 mmol/L (ref 135–145)

## 2022-02-19 LAB — TROPONIN I (HIGH SENSITIVITY)
Troponin I (High Sensitivity): 18 ng/L — ABNORMAL HIGH (ref <18)
Troponin I (High Sensitivity): 16 ng/L (ref <18)

## 2022-02-19 MED ORDER — METOCLOPRAMIDE HCL 5 MG/ML IJ SOLN
10.0000 mg | Freq: Once | INTRAMUSCULAR | Status: AC
  Administered 2022-02-19: 10 mg via INTRAVENOUS
  Filled 2022-02-19: qty 2

## 2022-02-19 MED ORDER — LOSARTAN POTASSIUM 50 MG PO TABS
50.0000 mg | ORAL_TABLET | Freq: Once | ORAL | Status: AC
Start: 2022-02-19 — End: 2022-02-19
  Administered 2022-02-19: 50 mg via ORAL
  Filled 2022-02-19: qty 1

## 2022-02-19 MED ORDER — KETOROLAC TROMETHAMINE 30 MG/ML IJ SOLN
30.0000 mg | Freq: Once | INTRAMUSCULAR | Status: AC
  Administered 2022-02-19: 30 mg via INTRAVENOUS
  Filled 2022-02-19: qty 1

## 2022-02-19 MED ORDER — DIPHENHYDRAMINE HCL 50 MG/ML IJ SOLN
25.0000 mg | Freq: Once | INTRAMUSCULAR | Status: AC
  Administered 2022-02-19: 25 mg via INTRAVENOUS
  Filled 2022-02-19: qty 1

## 2022-02-19 MED ORDER — MAGNESIUM SULFATE 2 GM/50ML IV SOLN
2.0000 g | Freq: Once | INTRAVENOUS | Status: AC
  Administered 2022-02-19: 2 g via INTRAVENOUS
  Filled 2022-02-19: qty 50

## 2022-02-19 MED ORDER — BUTALBITAL-APAP-CAFFEINE 50-325-40 MG PO TABS: 1.0000 | ORAL_TABLET | Freq: Four times a day (QID) | ORAL | 0 refills | Status: AC | PRN

## 2022-02-19 MED ORDER — SODIUM CHLORIDE 0.9 % IV BOLUS
500.0000 mL | Freq: Once | INTRAVENOUS | Status: AC
  Administered 2022-02-19: 500 mL via INTRAVENOUS

## 2022-02-19 NOTE — ED Provider Notes (Signed)
[facility] Provider Note   Event Date/Time   First MD Initiated Contact with Patient 02/19/22 [phone]     (approximate) History  Migraine  HPI Carlos Hall. is a 46 y.o. male  Location: Head Duration: 24 hours prior to arrival Timing: Stable since onset Severity: 9/10 Quality: Throbbing headache Context: Patient states that he has a history of migraines however they are very infrequent and states that starting yesterday began having throbbing global headache that is worse behind the eyes Modifying factors: Endorses photophobia, denies any OTC headache medicines improving his pain Associated Symptoms: Nausea, hypertension ROS: Patient currently denies any vision changes, tinnitus, difficulty speaking, facial droop, sore throat, chest pain, shortness of breath, abdominal pain, vomiting/diarrhea, dysuria, or weakness/numbness/paresthesias in any extremity   Physical Exam  Triage Vital Signs: ED Triage Vitals  Enc Vitals Group     BP 02/19/22 0432 (!) 163/113     Pulse Rate 02/19/22 0432 98     Resp 02/19/22 0432 18     Temp 02/19/22 0432 98.1 F (36.7 C)     Temp Source 02/19/22 0432 Oral     SpO2 02/19/22 0434 94 %     Weight 02/19/22 0432 (!) 315 lb (142.9 kg)     Height 02/19/22 0432 5\' 11"  (1.803 m)     Head Circumference --      Peak Flow --      Pain Score 02/19/22 0432 9     Pain Loc --      Pain Edu? --      Excl. in [place]? --    Most recent vital signs: Vitals:   02/19/22 0743 02/19/22 0916  BP: (!) 150/101 (!) 148/98  Pulse: 94 88  Resp: 16 16  Temp:    SpO2: 100% 99%   General: Awake, oriented x4. CV:  Good peripheral perfusion.  Resp:  Normal effort.  Abd:  No distention.  Other:  Middle-aged overweight American male sitting in bed in no acute distress ED Results / Procedures / Treatments  Labs (all labs ordered are listed, but only abnormal results are displayed) Labs Reviewed  CBC WITH DIFFERENTIAL/PLATELET - Abnormal;  Notable for the following components:      Result Value   WBC 13.7 (*)    RBC 6.49 (*)    MCV 79.8 (*)    MCH 24.7 (*)    RDW 19.6 (*)    Neutro Abs 11.4 (*)    All other components within normal limits  BASIC METABOLIC PANEL - Abnormal; Notable for the following components:   Glucose, Bld 112 (*)    Creatinine, Ser 1.29 (*)    All other components within normal limits  TROPONIN I (HIGH SENSITIVITY) - Abnormal; Notable for the following components:   Troponin I (High Sensitivity) 18 (*)    All other components within normal limits  TROPONIN I (HIGH SENSITIVITY)   EKG ED ECG REPORT I, 02/21/22, the attending physician, personally viewed and interpreted this ECG. Date: 02/19/2022 EKG Time: 0436 Rate: 101 Rhythm: Tachycardic sinus rhythm QRS Axis: normal Intervals: normal ST/T Wave abnormalities: normal Narrative Interpretation: Tachycardic sinus rhythm.  No evidence of acute ischemia RADIOLOGY ED MD interpretation: CT of the head without contrast interpreted by me shows no evidence of acute abnormalities including no intracerebral hemorrhage, obvious masses, or significant edema -Agree with radiology assessment Official radiology report(s): CT Head Wo Contrast  Result Date: 02/19/2022 CLINICAL DATA:  46 year old male history of chronic headache,  recently worsening. EXAM: CT HEAD WITHOUT CONTRAST TECHNIQUE: Contiguous axial images were obtained from the base of the skull through the vertex without intravenous contrast. RADIATION DOSE REDUCTION: This exam was performed according to the departmental dose-optimization program which includes automated exposure control, adjustment of the mA and/or kV according to patient size and/or use of iterative reconstruction technique. COMPARISON:  Head CT 03/24/2018. FINDINGS: Brain: No evidence of acute infarction, hemorrhage, hydrocephalus, extra-axial collection or mass lesion/mass effect. Vascular: No hyperdense vessel or unexpected  calcification. Skull: Normal. Negative for fracture or focal lesion. Sinuses/Orbits: No acute finding. Other: None. IMPRESSION: 1. No acute intracranial abnormalities. The appearance of the brain is normal. Electronically Signed   By: Trudie Reed M.D.   On: 02/19/2022 05:21   PROCEDURES: Critical Care performed: No .1-3 Lead EKG Interpretation  Performed by: Merwyn Katos, MD Authorized by: Merwyn Katos, MD     Interpretation: normal     ECG rate:  89   ECG rate assessment: normal     Rhythm: sinus rhythm     Ectopy: none     Conduction: normal    MEDICATIONS ORDERED IN ED: Medications  losartan (COZAAR) tablet 50 mg (50 mg Oral Given 02/19/22 0808)  metoCLOPramide (REGLAN) injection 10 mg (10 mg Intravenous Given 02/19/22 0811)  ketorolac (TORADOL) 30 MG/ML injection 30 mg (30 mg Intravenous Given 02/19/22 0808)  magnesium sulfate IVPB 2 g 50 mL (0 g Intravenous Stopped 02/19/22 0915)  diphenhydrAMINE (BENADRYL) injection 25 mg (25 mg Intravenous Given 02/19/22 0810)  sodium chloride 0.9 % bolus 500 mL (0 mLs Intravenous Stopped 02/19/22 0915)   IMPRESSION / MDM / ASSESSMENT AND PLAN / ED COURSE  I reviewed the triage vital signs and the nursing notes.                             The patient is on the cardiac monitor to evaluate for evidence of arrhythmia and/or significant heart rate changes. Patient's presentation is most consistent with acute presentation with potential threat to life or bodily function. This patient presents with a headache most consistent with benign headache from either tension type headache vs migraine. No headache red flags. Neurologic exam without evidence of meningismus, AMS, focal neurologic findings so doubt meningitis, encephalitis, stroke. Presentation not consistent with acute intracranial bleed to include SAH (lack of risk factors, headache history). No history of trauma so doubt ICH. Given history and physical temporal arteritis unlikely, as is  acute angle closure glaucoma. Doubt carotid artery dissection given no focal neuro deficits, no neck trauma or recent neck strain. Patient with no signs of increased intracranial pressure or weight loss and history and physical suggest more benign headache so less likely mass effect in brain from tumor or abscess or idiopathic intracranial hypertension. Pain was controlled with headache cocktail and patient discharged home with PMD follow up.   FINAL CLINICAL IMPRESSION(S) / ED DIAGNOSES   Final diagnoses:  Migraine without status migrainosus, not intractable, unspecified migraine type  Primary hypertension   Rx / DC Orders   ED Discharge Orders          Ordered    butalbital-acetaminophen-caffeine (FIORICET) 50-325-40 MG tablet  Every 6 hours PRN        02/19/22 0909           Note:  This document was prepared using Dragon voice recognition software and may include unintentional dictation errors.   Merwyn Katos,  MD 02/19/22 [id number]

## 2022-02-19 NOTE — ED Notes (Signed)
See triage note  presents with a 2 day hx of migraine  states he gets these h/a's about 2 times a year.Positive nausea

## 2022-02-19 NOTE — ED Triage Notes (Signed)
Pt to ED via POV, pt states has a migraine HA that states last night and lasted all day. Pt states hx of migraine HA, last time he had one he had to have IV medications to assist with pain. Pt drove himself to ED.   Pt states feels like he has a whooshing in his ears, and endorses photo sensitivity. Pt also endorses feeling dizzy at this time. Pt states pain worse when he is laying down.

## 2022-10-17 ENCOUNTER — Encounter: Payer: Self-pay | Admitting: Emergency Medicine

## 2022-10-17 ENCOUNTER — Emergency Department: Payer: Medicaid Other

## 2022-10-17 ENCOUNTER — Emergency Department
Admission: EM | Admit: 2022-10-17 | Discharge: 2022-10-17 | Disposition: A | Discharge status: Home / Self Care | Payer: Medicaid Other | Attending: Emergency Medicine | Admitting: Emergency Medicine

## 2022-10-17 ENCOUNTER — Other Ambulatory Visit: Payer: Self-pay

## 2022-10-17 DIAGNOSIS — E119 Type 2 diabetes mellitus without complications: Secondary | ICD-10-CM | POA: Diagnosis not present

## 2022-10-17 DIAGNOSIS — M25512 Pain in left shoulder: Secondary | ICD-10-CM | POA: Diagnosis present

## 2022-10-17 DIAGNOSIS — M545 Low back pain, unspecified: Secondary | ICD-10-CM | POA: Insufficient documentation

## 2022-10-17 DIAGNOSIS — Y92410 Unspecified street and highway as the place of occurrence of the external cause: Secondary | ICD-10-CM | POA: Insufficient documentation

## 2022-10-17 MED ORDER — CYCLOBENZAPRINE HCL 5 MG PO TABS: 5.0000 mg | ORAL_TABLET | Freq: Three times a day (TID) | ORAL | 0 refills | Status: DC | PRN

## 2022-10-17 MED ORDER — TRAMADOL HCL 50 MG PO TABS
50.0000 mg | ORAL_TABLET | Freq: Once | ORAL | Status: AC
  Administered 2022-10-17: 50 mg via ORAL
  Filled 2022-10-17: qty 1

## 2022-10-17 MED ORDER — TRAMADOL HCL 50 MG PO TABS: 50.0000 mg | ORAL_TABLET | Freq: Four times a day (QID) | ORAL | 0 refills | Status: AC | PRN
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
## 2023-03-29 ENCOUNTER — Emergency Department
Admission: EM | Admit: 2023-03-29 | Discharge: 2023-03-29 | Disposition: A | Discharge status: Home / Self Care | Payer: Medicaid Other | Attending: Emergency Medicine | Admitting: Emergency Medicine

## 2023-03-29 ENCOUNTER — Other Ambulatory Visit: Payer: Self-pay

## 2023-03-29 ENCOUNTER — Encounter: Payer: Self-pay | Admitting: Emergency Medicine

## 2023-03-29 DIAGNOSIS — H01001 Unspecified blepharitis right upper eyelid: Secondary | ICD-10-CM | POA: Diagnosis not present

## 2023-03-29 DIAGNOSIS — H0289 Other specified disorders of eyelid: Secondary | ICD-10-CM | POA: Diagnosis present

## 2023-03-29 MED ORDER — TETRACAINE HCL 0.5 % OP SOLN
1.0000 [drp] | Freq: Once | OPHTHALMIC | Status: AC
  Administered 2023-03-29: 1 [drp] via OPHTHALMIC
  Filled 2023-03-29: qty 4

## 2023-03-29 MED ORDER — ERYTHROMYCIN 5 MG/GM OP OINT: 1.0000 | TOPICAL_OINTMENT | Freq: Every day | OPHTHALMIC | 0 refills | Status: DC

## 2023-03-29 MED ORDER — ERYTHROMYCIN 5 MG/GM OP OINT
TOPICAL_OINTMENT | Freq: Once | OPHTHALMIC | Status: AC
  Administered 2023-03-29: 1 via OPHTHALMIC
  Filled 2023-03-29: qty 1

## 2023-03-29 MED ORDER — FLUORESCEIN SODIUM 1 MG OP STRP
1.0000 | ORAL_STRIP | Freq: Once | OPHTHALMIC | Status: AC
  Administered 2023-03-29: 1 via OPHTHALMIC
  Filled 2023-03-29: qty 1

## 2023-03-29 NOTE — ED Provider Notes (Signed)
[facility] Provider Note   Event Date/Time   First MD Initiated Contact with Patient 03/29/23 0354     (approximate) History  Blepharitis  HPI Carlos Hall. is a 47 y.o. male with a stated past medical history of seasonal allergies who presents for right upper eyelid swelling that has been present over the last 3 days.  Patient states that initially started with some itching to the right eye and tearing but is now progressed to swelling of the right eyelid as well.  Patient also endorses mild blurred vision on the right side. ROS: Patient currently denies any vision changes, tinnitus, difficulty speaking, facial droop, sore throat, chest pain, shortness of breath, abdominal pain, nausea/vomiting/diarrhea, dysuria, or weakness/numbness/paresthesias in any extremity   Physical Exam  Triage Vital Signs: ED Triage Vitals  Encounter Vitals Group     BP 03/29/23 0357 (!) 185/112     Systolic BP Percentile --      Diastolic BP Percentile --      Pulse Rate 03/29/23 0357 90     Resp 03/29/23 0357 18     Temp 03/29/23 0357 98.1 F (36.7 C)     Temp Source 03/29/23 0357 Oral     SpO2 03/29/23 0357 97 %     Weight 03/29/23 0347 (!) 320 lb (145.2 kg)     Height 03/29/23 0347 5\' 11"  (1.803 m)     Head Circumference --      Peak Flow --      Pain Score 03/29/23 0346 6     Pain Loc --      Pain Education --      Exclude from Growth Chart --    Most recent vital signs: Vitals:   03/29/23 0357  BP: (!) 185/112  Pulse: 90  Resp: 18  Temp: 98.1 F (36.7 C)  SpO2: 97%   General: Awake, oriented x4. CV:  Good peripheral perfusion.  Resp:  Normal effort.  Abd:  No distention.  Other:  Middle-aged obese African-American male laying on stretcher in no acute distress.  Edema to the right eyelid with mild erythema at the eyelashes Eye exam shows significant edema to the right eyelid with mild tenderness to palpation.  No evidence of ulceration or corneal  abrasion on fluorescein exam.  Visual acuity 20/20 in left eye and 20/40 in right ED Results / Procedures / Treatments  Labs (all labs ordered are listed, but only abnormal results are displayed) Labs Reviewed - No data to display  PROCEDURES: Critical Care performed: No Procedures MEDICATIONS ORDERED IN ED: Medications  fluorescein ophthalmic strip 1 strip (1 strip Right Eye Given 03/29/23 0426)  tetracaine (PONTOCAINE) 0.5 % ophthalmic solution 1 drop (1 drop Right Eye Given 03/29/23 0426)  erythromycin ophthalmic ointment (1 Application Right Eye Given 03/29/23 0438)   IMPRESSION / MDM / ASSESSMENT AND PLAN / ED COURSE  I reviewed the triage vital signs and the nursing notes.                             The patient is on the cardiac monitor to evaluate for evidence of arrhythmia and/or significant heart rate changes. Patient's presentation is most consistent with acute presentation with potential threat to life or bodily function. 47 year old male with history and exam consistent with blepharitis of the right eye.  Initial considerations in this patient included corneal abrasion, intraocular and corneal foreign bodies, corneal ulceration, various  etiologies of iritis, and various etiologies of conjunctivitis amongst others.  No evidence of foreign bodies with eversion of the eyelid.  Patient denies contact lens use, and has no other findings suggestive of corneal ulceration at this time.  No evidence of a positive Seidel test or other findings suggestive of globe perforation on evaluation in the ED.  No evidence of corneal foreign body on exam.   Significant improvement in pain and visual acuity noted with application of topical anesthetic to the eye.  Prior to discharge, we discussed return precautions, treatment with lubricating eye drops and NSAIDs, and follow up with primary care doctor today or tomorrow for further evaluation, and the patient demonstrated understanding and agreement.    FINAL CLINICAL IMPRESSION(S) / ED DIAGNOSES   Final diagnoses:  Blepharitis of right upper eyelid, unspecified type   Rx / DC Orders   ED Discharge Orders          Ordered    erythromycin ophthalmic ointment  Daily at bedtime        03/29/23 0435           Note:  This document was prepared using Dragon voice recognition software and may include unintentional dictation errors.   Merwyn Katos, MD 03/29/23 [phone]

## 2023-03-29 NOTE — ED Triage Notes (Signed)
Pt arrived via POV with reports of R eye lid swelling since Sunday, pt c/o increased tearing. Denies any contact or glasses use.

## 2023-09-09 ENCOUNTER — Emergency Department: Payer: Medicaid Other

## 2023-09-09 ENCOUNTER — Emergency Department
Admission: EM | Admit: 2023-09-09 | Discharge: 2023-09-09 | Disposition: A | Discharge status: Home / Self Care | Payer: Medicaid Other | Attending: Emergency Medicine | Admitting: Emergency Medicine

## 2023-09-09 DIAGNOSIS — E119 Type 2 diabetes mellitus without complications: Secondary | ICD-10-CM | POA: Diagnosis not present

## 2023-09-09 DIAGNOSIS — R310 Gross hematuria: Secondary | ICD-10-CM | POA: Diagnosis present

## 2023-09-09 LAB — URINALYSIS, ROUTINE W REFLEX MICROSCOPIC
Bacteria, UA: NONE SEEN
Bilirubin Urine: NEGATIVE
Glucose, UA: NEGATIVE mg/dL
Ketones, ur: NEGATIVE mg/dL
Leukocytes,Ua: NEGATIVE
Nitrite: NEGATIVE
Protein, ur: >=300 mg/dL — AB
RBC / HPF: >50 RBC/hpf (ref 0–5)
Specific Gravity, Urine: 1.012 (ref 1.005–1.030)
pH: 6 (ref 5.0–8.0)

## 2023-09-09 LAB — CBC
HCT: 48 % (ref 39.0–52.0)
Hemoglobin: 15.5 g/dL (ref 13.0–17.0)
MCH: 26.2 pg (ref 26.0–34.0)
MCHC: 32.3 g/dL (ref 30.0–36.0)
MCV: 81.2 fL (ref 80.0–100.0)
Platelets: 243 10*3/uL (ref 150–400)
RBC: 5.91 MIL/uL — ABNORMAL HIGH (ref 4.22–5.81)
RDW: 16.6 % — ABNORMAL HIGH (ref 11.5–15.5)
WBC: 8.1 10*3/uL (ref 4.0–10.5)
nRBC: 0 % (ref 0.0–0.2)

## 2023-09-09 LAB — BASIC METABOLIC PANEL
Anion gap: 8 (ref 5–15)
BUN: 21 mg/dL — ABNORMAL HIGH (ref 6–20)
CO2: 31 mmol/L (ref 22–32)
Calcium: 8.8 mg/dL — ABNORMAL LOW (ref 8.9–10.3)
Chloride: 101 mmol/L (ref 98–111)
Creatinine, Ser: 1.43 mg/dL — ABNORMAL HIGH (ref 0.61–1.24)
GFR, Estimated: >60 mL/min (ref >60)
Glucose, Bld: 163 mg/dL — ABNORMAL HIGH (ref 70–99)
Potassium: 4.6 mmol/L (ref 3.5–5.1)
Sodium: 140 mmol/L (ref 135–145)

## 2023-09-09 MED ORDER — AMOXICILLIN-POT CLAVULANATE 875-125 MG PO TABS: 1.0000 | ORAL_TABLET | Freq: Two times a day (BID) | ORAL | 0 refills | Status: DC

## 2023-09-09 NOTE — ED Provider Triage Note (Signed)
Emergency Medicine Provider Triage Evaluation Note  Keith Rake , a 48 y.o. male  was evaluated in triage.  Pt complains of hematuria, started yesterday, no flank pain, fever, chills. fDoes have some abd discomfort.  Review of Systems  Positive:  Negative:   Physical Exam  BP (!) 168/115   Pulse 94   Temp 98 F (36.7 C)   Resp 18   Ht 5\' 11"  (1.803 m)   Wt (!) 145.2 kg   SpO2 94%   BMI 44.65 kg/m  Gen:   Awake, no distress   Resp:  Normal effort  MSK:   Moves extremities without difficulty  Other:    Medical Decision Making  Medically screening exam initiated at 8:03 AM.  Appropriate orders placed.  Keith Rake. was informed that the remainder of the evaluation will be completed by another provider, this initial triage assessment does not replace that evaluation, and the importance of remaining in the ED until their evaluation is complete.  Basic labs, urine, if large amount of blood in the urine with no infection will look for kidney stone at that time.  Will wait to order for CT pending results   Faythe Ghee, PA-C 09/09/23 1610

## 2023-09-09 NOTE — ED Triage Notes (Signed)
Pt here with blood in his urine since last night. Pt states some mild abd pain. Pt denies diarrhea, chills , or back pain. Pt denies sexual partners.

## 2023-09-11 LAB — URINE CULTURE: Culture: NO GROWTH

## 2023-09-15 NOTE — ED Provider Notes (Signed)
[facility] Provider Note    Event Date/Time   First MD Initiated Contact with Patient 09/09/23 1205     (approximate)   History   Hematuria   HPI  Carlos Hall. is a 48 y.o. male with history of diabetes, sickle cell anemia, kidney stone presents emergency department with gross hematuria.  States unsure when it started but thinks it fever, chills, back pain.  No sexual partner.  He is just concerned because he noticed having blood in his urine is not normal.      Physical Exam   Triage Vital Signs: ED Triage Vitals  Encounter Vitals Group     BP 09/09/23 0802 (!) 168/115     Systolic BP Percentile --      Diastolic BP Percentile --      Pulse Rate 09/09/23 0802 99     Resp 09/09/23 0802 17     Temp 09/09/23 0802 98 F (36.7 C)     Temp src --      SpO2 09/09/23 0802 94 %     Weight 09/09/23 0803 (!) 320 lb 1.7 oz (145.2 kg)     Height 09/09/23 0803 5\' 11"  (1.803 m)     Head Circumference --      Peak Flow --      Pain Score 09/09/23 0803 5     Pain Loc --      Pain Education --      Exclude from Growth Chart --     Most recent vital signs: Vitals:   09/09/23 0802 09/09/23 1223  BP: (!) 168/115 (!) 166/112  Pulse: 94 74  Resp: 18 16  Temp: 98 F (36.7 C)   SpO2: 94% 98%     General: Awake, no distress.   CV:  Good peripheral perfusion. regular rate and  rhythm Resp:  Normal effort.  Abd:  No distention.  Nontender, no CVA tenderness Other:      ED Results / Procedures / Treatments   Labs (all labs ordered are listed, but only abnormal results are displayed) Labs Reviewed  URINALYSIS, ROUTINE W REFLEX MICROSCOPIC - Abnormal; Notable for the following components:      Result Value   Color, Urine YELLOW (*)    APPearance CLEAR (*)    Hgb urine dipstick MODERATE (*)    Protein, ur >=300 (*)    All other components within normal limits  BASIC METABOLIC PANEL - Abnormal; Notable for the following components:    Glucose, Bld 163 (*)    BUN 21 (*)    Creatinine, Ser 1.43 (*)    Calcium 8.8 (*)    All other components within normal limits  CBC - Abnormal; Notable for the following components:   RBC 5.91 (*)    RDW 16.6 (*)    All other components within normal limits  URINE CULTURE     EKG     RADIOLOGY CT renal stone    PROCEDURES:   Procedures Chief Complaint  Patient presents with   Hematuria      MEDICATIONS ORDERED IN ED: Medications - No data to display   IMPRESSION / MDM / ASSESSMENT AND PLAN / ED COURSE  I reviewed the triage vital signs and the nursing notes.                              Differential diagnosis includes, but is not limited to,  hematuria, kidney stone, UTI, bladder cancer  Patient's presentation is most consistent with acute illness / injury with system symptoms.   Patient's labs are reassuring, glucose still elevated at 163, has moderate amount hemoglobin but greater than 50 RBCs.  No sign of infection.  CT renal stone study independently reviewed interpreted by me as being negative for acute abnormality  I did explain findings to patient.  Since he has gross hematuria we will start him on antibiotic.  Have him follow-up with his regular doctor or urology.  Did explain to him this may be due to his diabetes.  His kidneys may not be functioning as well.  He states he understands.  Was discharged stable condition.  Strict instructions to return if worsening      FINAL CLINICAL IMPRESSION(S) / ED DIAGNOSES   Final diagnoses:  Gross hematuria     Rx / DC Orders   ED Discharge Orders          Ordered    amoxicillin-clavulanate (AUGMENTIN) 875-125 MG tablet  2 times daily        09/09/23 1213             Note:  This document was prepared using Dragon voice recognition software and may include unintentional dictation errors.    Faythe Ghee, PA-C 09/15/23 1130    Jene Every, MD 09/16/23 [phone]

## 2024-03-14 ENCOUNTER — Other Ambulatory Visit: Payer: Self-pay

## 2024-03-14 ENCOUNTER — Telehealth: Payer: Self-pay

## 2024-03-14 DIAGNOSIS — Z1211 Encounter for screening for malignant neoplasm of colon: Secondary | ICD-10-CM

## 2024-03-14 MED ORDER — NA SULFATE-K SULFATE-MG SULF 17.5-3.13-1.6 GM/177ML PO SOLN
1.0000 | Freq: Once | ORAL | 0 refills | Status: AC
Start: 2024-03-14 — End: 2024-03-14

## 2024-03-14 NOTE — Telephone Encounter (Signed)
 Gastroenterology Pre-Procedure Review  Request Date: 06/06/24 Requesting Physician: Dr. Marinda  PATIENT REVIEW QUESTIONS: The patient responded to the following health history questions as indicated:    1. Are you having any GI issues? no 2. Do you have a personal history of Polyps? no 3. Do you have a family history of Colon Cancer or Polyps? no 4. Diabetes Mellitus? yes (advised to stop ozempic 7 days and take 1/2 insulin  on the day before) 5. Joint replacements in the past 12 months?no 6. Major health problems in the past 3 months?no 7. Any artificial heart valves, MVP, or defibrillator?no    MEDICATIONS & ALLERGIES:    Patient reports the following regarding taking any anticoagulation/antiplatelet therapy:   Plavix, Coumadin, Eliquis, Xarelto, Lovenox , Pradaxa, Brilinta, or Effient? no Aspirin? no  Patient confirms/reports the following medications:  Current Outpatient Medications  Medication Sig Dispense Refill   allopurinol (ZYLOPRIM) 300 MG tablet Take 300 mg by mouth daily.     colchicine 0.6 MG tablet Take 0.6 mg by mouth daily.     Continuous Glucose Sensor (DEXCOM G7 SENSOR) MISC SMARTSIG:Every 10 Days     cyclobenzaprine  (FLEXERIL ) 5 MG tablet Take 1 tablet (5 mg total) by mouth 3 (three) times daily as needed for muscle spasms. 10 tablet 0   ibuprofen  (ADVIL ) 800 MG tablet Take 800 mg by mouth every 8 (eight) hours as needed.     insulin  NPH-regular Human (NOVOLIN 70/30) (70-30) 100 UNIT/ML injection Inject 30 Units into the skin 2 (two) times daily with a meal. 2 vial 1   losartan  (COZAAR ) 50 MG tablet Take 50 mg by mouth daily.     sildenafil (VIAGRA) 100 MG tablet Take 100 mg by mouth daily as needed.     testosterone cypionate (DEPOTESTOSTERONE CYPIONATE) 200 MG/ML injection Inject 200 mg into the muscle every 14 (fourteen) days.     traMADol  ([name] ) 50 MG tablet Take 1 tablet (50 mg total) by mouth every 6 (six) hours as needed for moderate pain. 10 tablet 0    amoxicillin -clavulanate (AUGMENTIN ) 875-125 MG tablet Take 1 tablet by mouth 2 (two) times daily. (Patient not taking: Reported on 03/14/2024) 28 tablet 0   erythromycin  ophthalmic ointment Place 1 Application into the right eye at bedtime. 3.5 g 0   No current facility-administered medications for this visit.    Patient confirms/reports the following allergies:  No Known Allergies  No orders of the defined types were placed in this encounter.   AUTHORIZATION INFORMATION Primary Insurance: 1D#: Group #:  Secondary Insurance: 1D#: Group #:  SCHEDULE INFORMATION: Date: 06/06/24 Time: Location: [facility]

## 2024-06-06 ENCOUNTER — Ambulatory Visit
Admission: RE | Admit: 2024-06-06 | Discharge: 2024-06-06 | Disposition: A | Discharge status: Home / Self Care | Attending: General Surgery | Admitting: General Surgery

## 2024-06-06 ENCOUNTER — Ambulatory Visit: Admitting: Certified Registered Nurse Anesthetist

## 2024-06-06 ENCOUNTER — Other Ambulatory Visit: Payer: Self-pay

## 2024-06-06 ENCOUNTER — Encounter
Admission: RE | Disposition: A | Discharge status: Home / Self Care | Payer: Self-pay | Source: Home / Self Care | Attending: General Surgery

## 2024-06-06 ENCOUNTER — Encounter: Payer: Self-pay | Admitting: General Surgery

## 2024-06-06 DIAGNOSIS — K5730 Diverticulosis of large intestine without perforation or abscess without bleeding: Secondary | ICD-10-CM | POA: Insufficient documentation

## 2024-06-06 DIAGNOSIS — Z794 Long term (current) use of insulin: Secondary | ICD-10-CM | POA: Diagnosis not present

## 2024-06-06 DIAGNOSIS — Z79899 Other long term (current) drug therapy: Secondary | ICD-10-CM | POA: Diagnosis not present

## 2024-06-06 DIAGNOSIS — I10 Essential (primary) hypertension: Secondary | ICD-10-CM | POA: Diagnosis not present

## 2024-06-06 DIAGNOSIS — Z7984 Long term (current) use of oral hypoglycemic drugs: Secondary | ICD-10-CM | POA: Diagnosis not present

## 2024-06-06 DIAGNOSIS — Z1211 Encounter for screening for malignant neoplasm of colon: Secondary | ICD-10-CM | POA: Diagnosis not present

## 2024-06-06 DIAGNOSIS — E66813 Obesity, class 3: Secondary | ICD-10-CM | POA: Diagnosis not present

## 2024-06-06 DIAGNOSIS — Z6841 Body Mass Index (BMI) 40.0 and over, adult: Secondary | ICD-10-CM | POA: Insufficient documentation

## 2024-06-06 DIAGNOSIS — D573 Sickle-cell trait: Secondary | ICD-10-CM | POA: Insufficient documentation

## 2024-06-06 DIAGNOSIS — E119 Type 2 diabetes mellitus without complications: Secondary | ICD-10-CM | POA: Diagnosis not present

## 2024-06-06 DIAGNOSIS — R519 Headache, unspecified: Secondary | ICD-10-CM | POA: Insufficient documentation

## 2024-06-06 HISTORY — DX: Essential (primary) hypertension: I10

## 2024-06-06 HISTORY — PX: COLONOSCOPY: SHX5424

## 2024-06-06 LAB — GLUCOSE, CAPILLARY: Glucose-Capillary: 156 mg/dL — ABNORMAL HIGH (ref 70–99)

## 2024-06-06 SURGERY — COLONOSCOPY
Anesthesia: General

## 2024-06-06 MED ORDER — PROPOFOL 10 MG/ML IV BOLUS
INTRAVENOUS | Status: DC | PRN
  Administered 2024-06-06: 80 mg via INTRAVENOUS

## 2024-06-06 MED ORDER — PROPOFOL 500 MG/50ML IV EMUL
INTRAVENOUS | Status: DC | PRN
  Administered 2024-06-06: 150 ug/kg/min via INTRAVENOUS

## 2024-06-06 MED ORDER — GLYCOPYRROLATE 0.2 MG/ML IJ SOLN
INTRAMUSCULAR | Status: DC | PRN
Start: 2024-06-06 — End: 2024-06-06
  Administered 2024-06-06: .2 mg via INTRAVENOUS

## 2024-06-06 MED ORDER — SODIUM CHLORIDE 0.9 % IV SOLN: INTRAVENOUS | Status: DC

## 2024-06-06 NOTE — Op Note (Signed)
 [facility] Gastroenterology Patient Name: Carlos Hall Procedure Date: 06/06/2024 7:42 AM MRN: [id number] Account #: [id number] Date of Birth: [birth year] Admit Type: Outpatient Age: 48 Room: [facility] ENDO ROOM 4 Gender: Male Note Status: Finalized Instrument Name: Colon Scope [id number] Procedure:             Colonoscopy Indications:           Screening for colorectal malignant neoplasm Providers:             Jayson KIDD. Marinda, MD Referring MD:          Sarah Swaziland, MD (Referring MD) Medicines:             See the Anesthesia note for documentation of the                         administered medications Complications:         No immediate complications. Estimated blood loss: None. Procedure:             Pre-Anesthesia Assessment:                        - Prior to the procedure, a History and Physical was                         performed, and patient medications and allergies were                         reviewed. The patient's tolerance of previous                         anesthesia was also reviewed. The risks and benefits                         of the procedure and the sedation options and risks                         were discussed with the patient. All questions were                         answered, and informed consent was obtained. Prior                         Anticoagulants: The patient has taken no anticoagulant                         or antiplatelet agents. ASA Grade Assessment: III - A                         patient with severe systemic disease. After reviewing                         the risks and benefits, the patient was deemed in                         satisfactory condition to undergo the procedure.                        After obtaining informed consent, the colonoscope was  passed under direct vision. Throughout the procedure,                         the patient's blood pressure, pulse, and oxygen                          saturations were monitored continuously. The                         Colonoscope was introduced through the anus and                         advanced to the the cecum, identified by appendiceal                         orifice and ileocecal valve. The colonoscopy was                         performed with ease. The patient tolerated the                         procedure poorly due to the patient's body habitus,                         the patient's respiratory instability (hypoxia) and                         the patient's inability to tolerate conscious                         sedation. The quality of the bowel preparation was                         excellent. Findings:      The perianal and digital rectal examinations were normal.      The colon (entire examined portion) appeared normal.      A few medium-mouthed diverticula were found in the sigmoid colon. Impression:            - The entire examined colon is normal.                        - Diverticulosis in the sigmoid colon.                        - No specimens collected. Recommendation:        - Discharge patient to home.                        - Resume previous diet.                        - Repeat colonoscopy in 5 years because the                         examination was incomplete. Procedure Code(s):     --- Professional ---                        [phone], Colonoscopy, flexible; diagnostic, including  collection of specimen(s) by brushing or washing, when                         performed (separate procedure) CPT copyright 2022 [employer]. All rights reserved. The codes documented in this report are preliminary and upon coder review may  be revised to meet current compliance requirements. Jayson MALVA Endow, MD 06/06/2024 8:41:19 AM Number of Addenda: 0 Note Initiated On: 06/06/2024 7:42 AM Scope Withdrawal Time: 0 hours 6 minutes 29 seconds  Total Procedure Duration: 0 hours 10 minutes  53 seconds       [facility]

## 2024-06-06 NOTE — Anesthesia Preprocedure Evaluation (Addendum)
 Anesthesia Evaluation  Patient identified by MRN, date of birth, ID band Patient awake    Reviewed: Allergy & Precautions, NPO status , Patient's Chart, lab work & pertinent test results  History of Anesthesia Complications Negative for: history of anesthetic complications  Airway Mallampati: III  TM Distance: >3 FB Neck ROM: full    Dental no notable dental hx.    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 No date: Diabetes mellitus without complication (HCC)     Comment:  type 2 No date: Sickle cell anemia (HCC)     Comment:  traits  Past Surgical History: No date: KNEE ARTHROSCOPY No date: ROTATOR CUFF REPAIR  BMI    Body Mass Index: 46.39 kg/m      Reproductive/Obstetrics negative OB ROS                              Anesthesia Physical Anesthesia Plan  ASA: 3  Anesthesia Plan: General   Post-op Pain Management: Minimal or no pain anticipated   Induction: Intravenous  PONV Risk Score and Plan: 2 and Propofol infusion and TIVA  Airway Management Planned: Natural Airway and Nasal Cannula  Additional Equipment:   Intra-op Plan:   Post-operative Plan:   Informed Consent: I have reviewed the patients History and Physical, chart, labs and discussed the procedure including the risks, benefits and alternatives for the proposed anesthesia with the patient or authorized representative who has  indicated his/her understanding and acceptance.     Dental Advisory Given  Plan Discussed with: Anesthesiologist, CRNA and Surgeon  Anesthesia Plan Comments: (Patient consented for risks of anesthesia including but not limited to:  - adverse reactions to medications - risk of airway placement if required - damage to eyes, teeth, lips or other oral mucosa - nerve damage due to positioning  - sore throat or hoarseness - Damage to heart, brain, nerves, lungs, other parts of body or loss of life  Patient voiced understanding and assent.)         Anesthesia Quick Evaluation

## 2024-06-06 NOTE — H&P (Signed)
 Primary Care Physician:  Swazil[place], [name], MD Primary Gastroenterologist:  Dr. Marinda  Pre-Procedure History & Physical: HPI:  Carlos Hall. is a 48 y.o. male is here for an colonoscopy.   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 Authorizing Provider  allopurinol (ZYLOPRIM) 300 MG tablet Take 300 mg by mouth daily.   Yes [provider]  insulin  NPH-regular Human (NOVOLIN 70/30) (70-30) 100 UNIT/ML injection Inject 30 Units into the skin 2 (two) times daily with a meal. 03/27/18  Yes Gouru, Aruna, MD  Semaglutide,0.25 or 0.5MG /DOS, (OZEMPIC, 0.25 OR 0.5 MG/DOSE,) 2 MG/1.5ML SOPN Inject into the skin.   Yes [provider]  sildenafil (VIAGRA) 100 MG tablet Take 100 mg by mouth daily as needed. 05/05/21  Yes [provider]  amoxicillin -clavulanate (AUGMENTIN ) 875-125 MG tablet Take 1 tablet by mouth 2 (two) times daily. Patient not taking: Reported on 03/14/2024 09/09/23   Gasper Devere ORN, PA-C  colchicine 0.6 MG tablet Take 0.6 mg by mouth daily.    [provider]  Continuous Glucose Sensor (DEXCOM G7 SENSOR) MISC SMARTSIG:Every 10 Days    [provider]  cyclobenzaprine  (FLEXERIL ) 5 MG tablet Take 1 tablet (5 mg total) by mouth 3 (three) times daily as needed for muscle spasms. 10/17/22   Dorothyann Drivers, MD  erythromycin  ophthalmic ointment Place 1 Application into the right eye at bedtime. 03/29/23   Bradler, Evan K, MD  ibuprofen  (ADVIL ) 800 MG tablet Take 800 mg by mouth every 8 (eight) hours as needed. 09/28/23   [provider]  losartan  (COZAAR ) 50 MG tablet Take 50 mg by mouth daily. 03/30/21   [provider]  testosterone cypionate (DEPOTESTOSTERONE CYPIONATE) 200 MG/ML injection Inject 200 mg into the muscle every 14 (fourteen) days. 08/04/21   [provider]  traMADol  ([name] ) 50 MG tablet Take 1 tablet (50 mg total) by mouth every 6 (six) hours as needed for moderate pain. 10/17/22   Dorothyann Drivers, MD    Allergies as of 03/14/2024   (No Known Allergies)    Family History  Problem Relation Age of Onset   Ovarian cancer Mother    CAD Father     Social History   Socioeconomic History   Marital status: Single    Spouse name: Not on file   Number of children: Not on file   Years of education: Not on file   Highest education level: Not on file  Occupational History   Not on file  Tobacco Use   Smoking status: Never   Smokeless tobacco: Never  Vaping Use   Vaping status: Never Used  Substance and Sexual Activity   Alcohol use: Yes    Comment: occ   Drug use: Yes    Types: Marijuana    Comment: 3 weeks ago   Sexual activity: Yes  Other Topics Concern   Not on file  Social History Narrative   Not on file   Social Drivers of Health   Financial Resource Strain: Not on file  Food Insecurity: Not on file  Transportation Needs:  Not on file  Physical Activity: Not on file  Stress: Not on file  Social Connections: Not on file  Intimate Partner Violence: Not At Risk (02/21/2024)   Received from [facility]   Humiliation, Afraid, Rape, and Kick questionnaire    Within the last year, have you been afraid of your partner or ex-partner?: No    Within the last year, have you been humiliated or emotionally abused in other ways by your partner or ex-partner?: No    Within the last year, have you been kicked, hit, slapped, or otherwise physically hurt by your partner or ex-partner?: No    Within the last year, have you been raped or forced to have any kind of sexual activity by your partner or ex-partner?: No    Review of Systems: See HPI, otherwise  negative ROS  Physical Exam: BP (!) 150/111   Pulse 95   Temp (!) 96.9 F (36.1 C) (Temporal)   Resp 18   Ht 5' 11 (1.803 m)   Wt (!) 150.9 kg   SpO2 98%   BMI 46.39 kg/m  General:   Alert,  pleasant and cooperative in NAD Head:  Normocephalic and atraumatic. Neck:  Supple; no masses or thyromegaly. Lungs:  Clear throughout to auscultation.    Heart:  Regular rate and rhythm. Abdomen:  Soft, nontender and nondistended. Normal bowel sounds, without guarding, and without rebound.   Neurologic:  Alert and  oriented x4;  grossly normal neurologically.  Impression/Plan: Carlos E Macrae Jr. is here for an colonoscopy to be performed for screening  Risks, benefits, limitations, and alternatives regarding  colonoscopy have been reviewed with the patient.  Questions have been answered.  All parties agreeable.   Jayson MALVA Endow, MD  06/06/2024, 8:09 AM

## 2024-06-06 NOTE — Transfer of Care (Signed)
 Immediate Anesthesia Transfer of Care Note  Patient: Carlos Hall.  Procedure(s) Performed: COLONOSCOPY  Patient Location: PACU  Anesthesia Type:General  Level of Consciousness: awake and alert   Airway & Oxygen Therapy: Patient Spontanous Breathing and Patient connected to face mask oxygen  Post-op Assessment: Report given to RN and Post -op Vital signs reviewed and stable  Post vital signs: Reviewed and stable  Last Vitals:  Vitals Value Taken Time  BP 140/79 06/06/24 08:41  Temp 35.6 C 06/06/24 08:41  Pulse 40 06/06/24 08:45  Resp 27 06/06/24 08:47  SpO2 96 % 06/06/24 08:45  Vitals shown include unfiled device data.  Last Pain:  Vitals:   06/06/24 0841  TempSrc: Temporal  PainSc: 0-No pain         Complications: No notable events documented.

## 2024-06-06 NOTE — Anesthesia Postprocedure Evaluation (Signed)
 Anesthesia Post Note  Patient: Carlos Hall.  Procedure(s) Performed: COLONOSCOPY  Patient location during evaluation: Endoscopy Anesthesia Type: General Level of consciousness: awake and alert Pain management: pain level controlled Vital Signs Assessment: post-procedure vital signs reviewed and stable Respiratory status: spontaneous breathing, nonlabored ventilation, respiratory function stable and patient connected to nasal cannula oxygen Cardiovascular status: blood pressure returned to baseline and stable Postop Assessment: no apparent nausea or vomiting Anesthetic complications: no   No notable events documented.   Last Vitals:  Vitals:   06/06/24 0841 06/06/24 0851  BP: (!) 140/79   Pulse:    Resp:    Temp: (!) 35.6 C   SpO2:  94%    Last Pain:  Vitals:   06/06/24 0841  TempSrc: Temporal  PainSc: 0-No pain                 Lendia LITTIE Mae

## 2024-06-12 DIAGNOSIS — M5412 Radiculopathy, cervical region: Secondary | ICD-10-CM | POA: Diagnosis not present

## 2024-06-12 DIAGNOSIS — R202 Paresthesia of skin: Secondary | ICD-10-CM | POA: Diagnosis not present

## 2024-06-12 DIAGNOSIS — E119 Type 2 diabetes mellitus without complications: Secondary | ICD-10-CM | POA: Insufficient documentation

## 2024-06-12 DIAGNOSIS — S14101S Unspecified injury at C1 level of cervical spinal cord, sequela: Secondary | ICD-10-CM | POA: Diagnosis not present

## 2024-06-12 DIAGNOSIS — I10 Essential (primary) hypertension: Secondary | ICD-10-CM | POA: Insufficient documentation

## 2024-06-12 DIAGNOSIS — M542 Cervicalgia: Secondary | ICD-10-CM | POA: Diagnosis present

## 2024-06-13 ENCOUNTER — Emergency Department

## 2024-06-13 ENCOUNTER — Other Ambulatory Visit: Payer: Self-pay

## 2024-06-13 ENCOUNTER — Emergency Department
Admission: EM | Admit: 2024-06-13 | Discharge: 2024-06-13 | Disposition: A | Discharge status: Home / Self Care | Attending: Emergency Medicine | Admitting: Emergency Medicine

## 2024-06-13 DIAGNOSIS — M542 Cervicalgia: Secondary | ICD-10-CM | POA: Diagnosis not present

## 2024-06-13 DIAGNOSIS — M5412 Radiculopathy, cervical region: Secondary | ICD-10-CM | POA: Diagnosis not present

## 2024-06-13 DIAGNOSIS — R200 Anesthesia of skin: Secondary | ICD-10-CM | POA: Diagnosis present

## 2024-06-13 DIAGNOSIS — M5410 Radiculopathy, site unspecified: Secondary | ICD-10-CM

## 2024-06-13 DIAGNOSIS — M5010 Cervical disc disorder with radiculopathy, unspecified cervical region: Secondary | ICD-10-CM | POA: Insufficient documentation

## 2024-06-13 DIAGNOSIS — R202 Paresthesia of skin: Secondary | ICD-10-CM

## 2024-06-13 DIAGNOSIS — S14101S Unspecified injury at C1 level of cervical spinal cord, sequela: Secondary | ICD-10-CM | POA: Diagnosis not present

## 2024-06-13 DIAGNOSIS — S14101A Unspecified injury at C1 level of cervical spinal cord, initial encounter: Secondary | ICD-10-CM

## 2024-06-13 LAB — CBC WITH DIFFERENTIAL/PLATELET
Abs Immature Granulocytes: 0.11 K/uL — ABNORMAL HIGH (ref 0.00–0.07)
Basophils Absolute: 0 K/uL (ref 0.0–0.1)
Basophils Relative: 1 %
Eosinophils Absolute: 0.2 K/uL (ref 0.0–0.5)
Eosinophils Relative: 3 %
HCT: 45.3 % (ref 39.0–52.0)
Hemoglobin: 14.4 g/dL (ref 13.0–17.0)
Immature Granulocytes: 1 %
Lymphocytes Relative: 23 %
Lymphs Abs: 1.7 K/uL (ref 0.7–4.0)
MCH: 26.1 pg (ref 26.0–34.0)
MCHC: 31.8 g/dL (ref 30.0–36.0)
MCV: 82.2 fL (ref 80.0–100.0)
Monocytes Absolute: 0.4 K/uL (ref 0.1–1.0)
Monocytes Relative: 6 %
Neutro Abs: 5.1 K/uL (ref 1.7–7.7)
Neutrophils Relative %: 66 %
Platelets: 236 K/uL (ref 150–400)
RBC: 5.51 MIL/uL (ref 4.22–5.81)
RDW: 16.5 % — ABNORMAL HIGH (ref 11.5–15.5)
WBC: 7.7 K/uL (ref 4.0–10.5)
nRBC: 0 % (ref 0.0–0.2)

## 2024-06-13 LAB — COMPREHENSIVE METABOLIC PANEL WITH GFR
ALT: 26 U/L (ref 0–44)
AST: 29 U/L (ref 15–41)
Albumin: 3 g/dL — ABNORMAL LOW (ref 3.5–5.0)
Alkaline Phosphatase: 45 U/L (ref 38–126)
Anion gap: 9 (ref 5–15)
BUN: 33 mg/dL — ABNORMAL HIGH (ref 6–20)
CO2: 30 mmol/L (ref 22–32)
Calcium: 8.6 mg/dL — ABNORMAL LOW (ref 8.9–10.3)
Chloride: 99 mmol/L (ref 98–111)
Creatinine, Ser: 1.98 mg/dL — ABNORMAL HIGH (ref 0.61–1.24)
GFR, Estimated: 41 mL/min — ABNORMAL LOW (ref >60)
Glucose, Bld: 140 mg/dL — ABNORMAL HIGH (ref 70–99)
Potassium: 4.6 mmol/L (ref 3.5–5.1)
Sodium: 138 mmol/L (ref 135–145)
Total Bilirubin: 0.4 mg/dL (ref 0.0–1.2)
Total Protein: 6.8 g/dL (ref 6.5–8.1)

## 2024-06-13 LAB — RAPID HIV SCREEN (HIV 1/2 AB+AG)
HIV 1/2 Antibodies: NONREACTIVE
HIV-1 P24 Antigen - HIV24: NONREACTIVE

## 2024-06-13 MED ORDER — METHYLPREDNISOLONE 4 MG PO TABS: ORAL_TABLET | ORAL | 0 refills | Status: AC

## 2024-06-13 MED ORDER — ACETAMINOPHEN 500 MG PO TABS
1000.0000 mg | ORAL_TABLET | Freq: Once | ORAL | Status: AC
  Administered 2024-06-13: 1000 mg via ORAL
  Filled 2024-06-13: qty 2

## 2024-06-13 MED ORDER — DIAZEPAM 5 MG PO TABS
5.0000 mg | ORAL_TABLET | Freq: Once | ORAL | Status: AC
  Administered 2024-06-13: 5 mg via ORAL
  Filled 2024-06-13: qty 1

## 2024-06-13 MED ORDER — KETOROLAC TROMETHAMINE 15 MG/ML IJ SOLN
30.0000 mg | Freq: Once | INTRAMUSCULAR | Status: AC
  Administered 2024-06-13: 30 mg via INTRAMUSCULAR
  Filled 2024-06-13: qty 2

## 2024-06-13 MED ORDER — LIDOCAINE 5 % EX PTCH
1.0000 | MEDICATED_PATCH | CUTANEOUS | Status: DC
  Administered 2024-06-13: 1 via TRANSDERMAL
  Filled 2024-06-13: qty 1

## 2024-06-13 NOTE — ED Notes (Signed)
 Writer noticed pt oxygen to be bwteen 82-84% while he was aslepe. Woke pt up and o2 did not improve. Pt placed on 2L and oxygen improved to 96. EDP, Cyrena, notified.

## 2024-06-13 NOTE — Discharge Instructions (Addendum)
 Please follow-up closely with neurosurgery and neurology.  Call today to make appointments.  Return to the ER right away if your weakness worsens, start having severe pain, difficulty walking, fevers chills severe back or neck pain or other concerns arise.

## 2024-06-13 NOTE — ED Notes (Signed)
Pt placed on monitor at  this time

## 2024-06-13 NOTE — ED Notes (Signed)
 Pt verbalizes understanding of discharge instructions. Opportunity for questioning and answers were provided. Pt discharged from ED to home.   ? ?

## 2024-06-13 NOTE — ED Notes (Signed)
 Heat pack wrapped in a pillowcase applied to neck. Patient tolerating.

## 2024-06-13 NOTE — ED Notes (Signed)
Neurosurgeon at bedside °

## 2024-06-13 NOTE — Consult Note (Signed)
 Consulting Department:  Emergency department  Primary Physician:  [place], [name], MD  Chief Complaint: Right upper extremity numbness and pain  History of Present Illness: 06/13/2024 Carlos Hall. is a 48 y.o. male who presents with the chief complaint of neck pain and right upper extremity pain.  States that this has been worsening over the past 2 weeks.  He gets numbness and tingling down his right arm.  No obvious trauma or inciting event.  No constitutional symptoms.  He gets pain mostly in his neck and his upper trapezius.  He gets some radiating pain down the entire right upper extremity mostly in the lateral distribution.  He does have a history of a previous cervical spine trauma in the early 2000's that resulted in multiple cervical spine fractures and left upper extremity weakness.  He has developed contractures in his left hand and has had weakness on his left side ever since then.  The symptoms are causing a significant impact on the patient's life.   Review of Systems:  A 10 point review of systems is negative, except for the pertinent positives and negatives detailed in the HPI.  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    Diabetes mellitus without complication (HCC)    type 2   Hypertension    Sickle cell anemia (HCC)    traits    Past Surgical History: Past Surgical History:  Procedure Laterality Date   COLONOSCOPY N/A 06/06/2024   Procedure: COLONOSCOPY;  Surgeon: [name], MD;  Location: [facility] ENDOSCOPY;  Service: General;  Laterality: N/A;   KNEE ARTHROSCOPY     ROTATOR CUFF REPAIR      Allergies: Allergies as of 06/12/2024   (No Known Allergies)    Medications:  Current Facility-Administered Medications:    lidocaine (LIDODERM) 5 % 1 patch, 1 patch, Transdermal, Q24H, [name], MD, 1 patch at 06/13/24 9493  Current Outpatient Medications:    allopurinol (ZYLOPRIM) 300 MG tablet,  Take 300 mg by mouth daily., Disp: , Rfl:    amoxicillin -clavulanate (AUGMENTIN ) 875-125 MG tablet, Take 1 tablet by mouth 2 (two) times daily. (Patient not taking: Reported on 03/14/2024), Disp: 28 tablet, Rfl: 0   colchicine 0.6 MG tablet, Take 0.6 mg by mouth daily., Disp: , Rfl:    Continuous Glucose Sensor (DEXCOM G7 SENSOR) MISC, SMARTSIG:Every 10 Days, Disp: , Rfl:    cyclobenzaprine  (FLEXERIL ) 5 MG tablet, Take 1 tablet (5 mg total) by mouth 3 (three) times daily as needed for muscle spasms., Disp: 10 tablet, Rfl: 0   erythromycin  ophthalmic ointment, Place 1 Application into the right eye at bedtime., Disp: 3.5 g, Rfl: 0   ibuprofen  (ADVIL ) 800 MG tablet, Take 800 mg by mouth every 8 (eight) hours as needed., Disp: , Rfl:    insulin  NPH-regular Human (NOVOLIN 70/30) (70-30) 100 UNIT/ML injection, Inject 30 Units into the skin 2 (two) times daily with a meal., Disp: 2 vial, Rfl: 1   losartan  (COZAAR ) 50 MG tablet, Take 50 mg by mouth daily., Disp: , Rfl:    Semaglutide,0.25 or 0.5MG /DOS, (OZEMPIC, 0.25 OR 0.5 MG/DOSE,) 2 MG/1.5ML SOPN, Inject into the skin., Disp: , Rfl:    sildenafil (VIAGRA) 100 MG tablet, Take 100 mg by mouth daily as needed., Disp: , Rfl:    testosterone cypionate (DEPOTESTOSTERONE CYPIONATE) 200 MG/ML injection, Inject 200 mg into the muscle every 14 (fourteen) days., Disp: , Rfl:    traMADol  ([name] )  50 MG tablet, Take 1 tablet (50 mg total) by mouth every 6 (six) hours as needed for moderate pain., Disp: 10 tablet, Rfl: 0   Social History: Social History   Tobacco Use   Smoking status: Never   Smokeless tobacco: Never  Vaping Use   Vaping status: Never Used  Substance Use Topics   Alcohol use: Yes    Comment: occ   Drug use: Yes    Types: Marijuana    Comment: 3 weeks ago    Family Medical History: Family History  Problem Relation Age of Onset   Ovarian cancer Mother    CAD Father     Physical Examination: Vitals:   06/13/24 0830 06/13/24 0836   BP: (!) 159/89   Pulse: 87   Resp: (!) 26   Temp:  (!) 97.3 F (36.3 C)  SpO2: 97%      General: Patient is well developed, well nourished, calm, collected, and in no apparent distress.  NEUROLOGICAL:  General: In no acute distress.   Awake, oriented to person, place, and time.  Pupils equal round and reactive to light.  Facial tone is symmetric.  ROM of spine: Pain with active and passive range of motion  Strength: Examination is somewhat blunted due to effort.  Left upper extremity is hand shows contracture formation.  He does have at least 4 out of 5 strength in the left bicep tricep and shoulder.  On the right side also limited by effort but at least 4-4+ throughout including handgrip wrist extension elbow flexion elbow extension.  He has limited range of motion in his right shoulder but has had a previous rotator cuff surgery.  \  He has some numbness and tingling in his C6 type distribution and some in his C5 type distribution.  He is diffusely hyporeflexic.  Imaging: MR BRAIN WO CONTRAST Result Date: 06/13/2024 EXAM: MRI BRAIN WITHOUT CONTRAST 06/13/2024 06:23:21 AM TECHNIQUE: Multiplanar multisequence MRI of the head/brain was performed without the administration of intravenous contrast. COMPARISON: None available. CLINICAL HISTORY: 48 year old male with acute neuro deficit, suspected stroke, multiple sclerosis?, neck pain, abnormal cervical spine MRI, and right upper extremity tingling. FINDINGS: BRAIN AND VENTRICLES: Brain volume within normal limits. Intermittent motion artifact most pronounced on axial fluid-attenuated inversion recovery (FLAIR) imaging. There is inferior bifrontal encephalomalacia (series 9 image 11). No obvious chronic cerebral blood products on susceptibility-weighted imaging (SWI). Otherwise gray and white matter signal appears within normal limits. No acute infarct. No intracranial hemorrhage. No mass. No midline shift. No hydrocephalus. The sella is  unremarkable. Normal flow voids. ORBITS: No acute abnormality. SINUSES AND MASTOIDS: No acute abnormality. BONES AND SOFT TISSUES: Normal skull bone marrow signal. No acute soft tissue abnormality. IMPRESSION: 1. Chronic inferior frontal gyrus encephalomalacia, most compatible with remote trauma. 2. Otherwise negative non-contrast MRI appearance of the brain. No acute intracranial abnormality. Electronically signed by: Helayne Hurst MD 06/13/2024 06:37 AM EDT RP Workstation: [id number]   MR Cervical Spine Wo Contrast Result Date: 06/13/2024 EXAM: MRI CERVICAL SPINE WITHOUT CONTRAST 06/13/2024 04:47:32 AM TECHNIQUE: Multiplanar multisequence MRI of the cervical spine was performed. COMPARISON: Head CT 03/24/2018. CLINICAL HISTORY: Myelopathy, acute, cervical spine; pain in R neck and trapezius with paresthesia RUE. Pt reports neck and upper back pain for the past few weeks, denies injury or fall. Pt reports intermittent numbness in hands. FINDINGS: BONES AND ALIGNMENT: Reversal of the normal cervical lordosis. Normal vertebral body heights. Bone marrow signal is unremarkable. No convincing marrow edema or acute osseous abnormality.  SPINAL CORD: Pronounced abnormal cervical spinal cord with long segment cervical spinal cord volume loss, and confluent abnormal cord T2 hyperintense heterogeneity from the base of the odontoid level through C4. Beginning at C5 and continuing into the visible upper thoracic spine the spinal cord signal and morphology appear more normal. Upper thoracic spinal canal is capacious. Axial images suggest a ventral CSF isointense fluid collection within the upper thoracic spinal canal such as subdural CSF or arachnoid cyst (series 12, image 40) measuring up to 6 mm in thickness. Mild thinning associated mass effect on the visible upper thoracic spinal cord where signal remains normal. Cervicomedullary junction has a more normal appearance. Grossly negative visible posterior fossa. SOFT TISSUES:  No paraspinal mass. Retropharyngeal course of both carotid arteries in the neck, normal variant. Preserved major vascular flow voids in the bilateral neck. DEGENERATIVE: Evidence of chronic cervical disc and endplate degeneration including disc bulging and endplate spurring. But there is no large disc or disc osteophyte complex, no significant cervical spinal stenosis in light of the decreased spinal cord volume. There is moderate degenerative neural foraminal stenosis at the right C4 through C6 nerve levels. IMPRESSION: 1. 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 This is nonspecific but might reflect sequelae of cord infarct or prior trauma. 2. Possible ventral upper thoracic spinal canal subdural or subarachnoid fluid collection with mild dorsal displacement of the upper thoracic cord, without significant cord mass effect. 3. Chronic cervical disc and endplate degeneration resulting in Moderate neural foraminal stenosis at the right C4 through C6 nerve levels. Electronically signed by: Helayne Hurst MD 06/13/2024 04:59 AM EDT RP Workstation: [id number]     I have personally reviewed the images and agree with the above interpretation.  He does have evidence of previous myelomalacia without severe cord compression at this time, likely due to his history of a cervical spinal cord injury which he states was approximately 20 years ago he has had left upper extremity weakness ever since that time.    Labs:    Latest Ref Rng & Units 06/13/2024    6:59 AM 09/09/2023    8:04 AM 02/19/2022    4:43 AM  CBC  WBC 4.0 - 10.5 K/uL 7.7  8.1  13.7   Hemoglobin 13.0 - 17.0 g/dL 85.5  84.4  83.9   Hematocrit 39.0 - 52.0 % 45.3  48.0  51.8   Platelets 150 - 400 K/uL 236  243  259       Latest Ref Rng & Units 06/13/2024    6:59 AM 09/09/2023    8:04 AM 02/19/2022    4:43 AM  BMP  Glucose 70 - 99 mg/dL 859  836  887   BUN 6 - 20 mg/dL 33  21  15   Creatinine 0.61 -  1.24 mg/dL 8.01  8.56  8.70   Sodium 135 - 145 mmol/L 138  140  137   Potassium 3.5 - 5.1 mmol/L 4.6  4.6  4.2   Chloride 98 - 111 mmol/L 99  101  99   CO2 22 - 32 mmol/L 30  31  29    Calcium 8.9 - 10.3 mg/dL 8.6  8.8  9.0         Assessment and Plan: Mr. Dealmeida is a pleasant 48 y.o. male with a complicated past medical history including a previous cervical spinal cord injury and left upper extremity trauma which demonstrated a chronic contracture of his left arm.  He does have chronic neck pain but states that is gotten worse over the past 2 weeks with numbness and tingling down the right upper extremity mostly down the arm and into the hand.  This appears to be mostly in a C5 and C6 type distribution.  On physical examination he has reasonable strength at least 4-4+ throughout in his right upper extremity with the exception of his right shoulder which is limited due to his previous rotator cuff surgery.  He is diffusely hyporeflexic but does show some decreased sensation in the right C6 distribution.  His MRI was performed, there is an extensive T2 signal from the dens to C4 but without ongoing severe compression.  This is likely due to his previous cervical spinal cord injury.  He does have some foraminal stenosis that appears to be right worse than left at C4-5 and C5-6 which could be causative of his symptomatology as these would be responsible for a C5 and C6 radiculopathy respectively.  Since he does not have a major motor deficit at this time he is at least 4-4+ we would recommend that he try conservative measures at first.  This would include a steroid taper and referral to pain medicine for cervical spinal injections.  He can follow-up in neurosurgery clinic with cervical flexion-extension x-rays.  At this time there is no emergent neurosurgical intervention needed.  Penne MICAEL Sharps, MD/MSCR Dept. of Neurosurgery

## 2024-06-13 NOTE — ED Triage Notes (Signed)
 Pt reports neck and upper back pain for the past few weeks, denies injury or fall. Pt reports intermittent numbness in hands

## 2024-06-13 NOTE — ED Provider Notes (Addendum)
 [facility] Provider Note    Event Date/Time   First MD Initiated Contact with Patient 06/13/24 [phone]     (approximate)   History   Neck Pain   HPI  Carlos Hall. is a 48 y.o. male   Past medical history of diabetes, hypertension, here with right sided neck and trapezius soreness, tightness, pain over the last 2 weeks with intermittent numbness and tingling down the right arm.  No obvious inciting event.  No surrounding illnesses, fevers, or other associated symptoms.  No headache or vision changes.  The neck pain and trapezius pain has been constant for 2 weeks.  The numbness and tingling down the entirety of the right arm initially was intermittent with no obvious causes, and would self resolve.  However this numbing and tingling sensation over the last 24 hours has become constant and persistent.   External Medical Documents Reviewed: Prior outpatient notes      Physical Exam   Triage Vital Signs: ED Triage Vitals [06/13/24 0002]  Encounter Vitals Group     BP (!) 152/86     Girls Systolic BP Percentile      Girls Diastolic BP Percentile      Boys Systolic BP Percentile      Boys Diastolic BP Percentile      Pulse Rate 99     Resp 18     Temp 98.2 F (36.8 C)     Temp src      SpO2 99 %     Weight (!) 330 lb (149.7 kg)     Height 5' 11 (1.803 m)     Head Circumference      Peak Flow      Pain Score      Pain Loc      Pain Education      Exclude from Growth Chart     Most recent vital signs: Vitals:   06/13/24 0002 06/13/24 0400  BP: (!) 152/86 (!) 142/85  Pulse: 99 85  Resp: 18 19  Temp: 98.2 F (36.8 C) 98.4 F (36.9 C)  SpO2: 99% 94%    General: Awake, no distress.  CV:  Good peripheral perfusion.  Resp:  Normal effort.  Abd:  No distention.  Other:  Awake alert comfortable pleasant gentleman in no acute distress.  Hypertensive otherwise vital signs normal.  Neck supple and able to range, no midline  tenderness but there is soreness to palpation in the right base of the neck musculature as well as the right trapezius.  He is able to range fully at bilateral upper extremities but grip strength the left side is poor due to previous accident, not new. Subj sensory numbness/tingling through the RUE.  Strength intact to the right upper extremity except for flexion at the elbow.  Lower extremities normal sensation and motor function.  No facial asymmetry.  No meningismus or fever.   ED Results / Procedures / Treatments   Labs (all labs ordered are listed, but only abnormal results are displayed) Labs Reviewed  COMPREHENSIVE METABOLIC PANEL WITH GFR  CBC WITH DIFFERENTIAL/PLATELET  RAPID HIV SCREEN (HIV 1/2 AB+AG)  RPR       RADIOLOGY I independently reviewed and interpreted MRI of the C-spine and see no obvious fractures, cord compression I also reviewed radiologist's formal read.   PROCEDURES:  Critical Care performed: No  Procedures   MEDICATIONS ORDERED IN ED: Medications  lidocaine (LIDODERM) 5 % 1 patch (1 patch Transdermal Patch  Applied 06/13/24 0506)  diazepam  (VALIUM ) tablet 5 mg (5 mg Oral Given 06/13/24 0506)  ketorolac  (TORADOL ) 15 MG/ML injection 30 mg (30 mg Intramuscular Given 06/13/24 0506)  acetaminophen  (TYLENOL ) tablet 1,000 mg (1,000 mg Oral Given 06/13/24 9493)    External physician / consultants:  I spoke with Dr. Penne Sharps of neurosurgery regarding care plan for this patient.   IMPRESSION / MDM / ASSESSMENT AND PLAN / ED COURSE  I reviewed the triage vital signs and the nursing notes.                                Patient's presentation is most consistent with acute presentation with potential threat to life or bodily function.  Differential diagnosis includes, but is not limited to, muscle spasms, muscle strain, myelopathy, considered but less likely stroke    MDM:    Muscle soreness and pain to the trapezius and base of the neck most  consistent with muscle strain but with the numbness and tingling sensation intermittent at first now constant to the right upper extremity and concern for nerve problem that originates in the cervical spine, got an MRI of the cervical spine which shows abnormalities including myelomalacia as a sequela perhaps of cord infarct or prior trauma, as well as possible spinal canal subdural or subarachnoid fluid, with these abnormal findings page sent out to neurosurgery for further discussion of evaluation and treatment options.  -- Abnormal MRI of the cervical spine reviewed with Dr. Penne Sharps of neurosurgery who recommends an MRI of the brain without contrast.  Patient stable unchanged findings continuing to have paresthesias of the right upper extremity.    MRI brain with no acute findings.  Rediscussion with Dr. Sharps of neurosurgery recommends involving neurology as it appears to be a noncompressive lesion that could be old transverse myelitis or MS or other neurologic etiology.  Patient stable and will await in-house neurology consultation when they arrive this morning.  Signed out to oncoming provider.        FINAL CLINICAL IMPRESSION(S) / ED DIAGNOSES   Final diagnoses:  Neck pain  Paresthesias     Rx / DC Orders   ED Discharge Orders     None        Note:  This document was prepared using Dragon voice recognition software and may include unintentional dictation errors.    [name], MD 06/13/24 9446    [name], MD 06/13/24 9344    [name], MD 06/13/24 [phone]

## 2024-06-13 NOTE — ED Provider Notes (Signed)
 CBC without acute abnormality.  Reassuring that white count is normal.  He is also afebrile.  Arguing against acute infection HIV negative  Additionally he has mild decrease in GFR, with slight departure from his baseline but not severe.  Clinical Course as of 06/13/24 1131  Wed Jun 13, 2024  0809 Dr. Matthews of neurology will see the patient in consult, discussed case. [MQ]  1111 Per Dr. Matthews probably a cervical radiculopathy from C4-C6.  He has at least 4-4+ strength on those sides so would not need emergent decompression.  Would recommend steroid taper and referral to pain for injections [MQ]  1111 Discussed with the patient is comfortable plan to discharge, he is use steroid in the past.  Advises he would be willing to follow-up with neurosurgery clinic, I will have him follow-up with both neurosurgery as well as neurology.  He is awake alert oriented without distress.  Agreeable with follow-up plan has been seen by both consultants now neurology and neurosurgery [MQ]    Clinical Course User Index [MQ] Dicky Anes, MD    Dr. Claudene of neurosurgery has reviewed the patient's clinical history and MRI, advises no acute neurosurgical issues.  Patient will be following up with both neurosurgery and neurology clinic.  Starting Medrol Dosepak.  Return precautions and treatment recommendations and follow-up discussed with the patient who is agreeable with the plan.    Dicky Anes, MD 06/13/24 1131

## 2024-06-13 NOTE — ED Notes (Signed)
 Dicky, MD at bedside at this time. Pt responsive to voice.

## 2024-06-14 LAB — RPR: RPR Ser Ql: NONREACTIVE

## 2024-07-04 ENCOUNTER — Other Ambulatory Visit: Payer: Self-pay | Admitting: Physician Assistant

## 2024-07-04 DIAGNOSIS — R52 Pain, unspecified: Secondary | ICD-10-CM

## 2024-07-04 NOTE — Progress Notes (Signed)
 Referring Physician:  Jordan, [name], MD [address] [place],  [place] [address]  Primary Physician:  Jordan, [name], MD  History of Present Illness: 07/10/2024 [name] is here today for follow-up after going to the emergency department approximately 1 month ago for increasing neck pain with numbness and tingling radiating into his right arm and hand.  He does have a previous cervical spinal cord injury and left upper extremity trauma which has resulted in a chronic contracture of his left hand and arm.  The pain radiates into all 5 of his fingers and he feels as though that he has become weaker.  He does feel as though his gait has changed for the past couple of months.  He was given a steroid when he was last seen which did help minimally, but feels as though he is having muscle spasms often.     Bowel/Bladder Dysfunction: none  Conservative measures:  Physical therapy:  Has not participated in Multimodal medical therapy including regular antiinflammatories:  Medrol Dosepak, cyclobenzaprine , ultram , ibuprofen  Injections:  No epidural steroid injections  Past Surgery: none   The symptoms are causing a significant impact on the patient's life.   Review of Systems:  A 10 point review of systems is negative, except for the pertinent positives and negatives detailed in the HPI.  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   colchicine 0.6 MG tablet Take 0.6 mg by mouth daily.   Continuous Glucose Sensor (DEXCOM G7 SENSOR) MISC SMARTSIG:Every 10 Days   ibuprofen  (ADVIL ) 200 MG tablet Take 200 mg by mouth every 6 (six) hours as needed.   insulin  NPH-regular Human (NOVOLIN 70/30) (70-30) 100 UNIT/ML injection Inject 30 Units into the skin 2 (two) times daily with a meal.   losartan  (COZAAR ) 50 MG tablet Take 50 mg by mouth daily.   Semaglutide,0.25 or 0.5MG /DOS, (OZEMPIC, 0.25 OR 0.5 MG/DOSE,) 2 MG/1.5ML SOPN Inject into the skin.   sildenafil (VIAGRA) 100 MG tablet Take 100 mg by mouth daily as needed.   testosterone cypionate (DEPOTESTOSTERONE CYPIONATE) 200 MG/ML injection Inject 200 mg into the muscle every 14 (fourteen) days.   traMADol  ([name] ) 50 MG tablet Take 1 tablet (50 mg total) by mouth every 6 (six) hours as needed for moderate pain.   [DISCONTINUED] amoxicillin -clavulanate (AUGMENTIN ) 875-125 MG tablet Take 1 tablet by mouth 2 (two) times daily. (Patient not taking: Reported on 03/14/2024)   [DISCONTINUED] cyclobenzaprine  (FLEXERIL ) 5 MG tablet Take 1 tablet (5 mg total) by mouth 3 (three) times daily as needed for muscle spasms.   [DISCONTINUED] erythromycin  ophthalmic ointment Place 1 Application into the right eye at bedtime.   [DISCONTINUED] ibuprofen  (ADVIL ) 800 MG tablet Take 800 mg by mouth every 8 (eight) hours as needed.  No facility-administered encounter medications on file as of 07/10/2024.    Social History: Social History   Tobacco Use   Smoking status: Never   Smokeless tobacco: Never  Vaping Use   Vaping status: Never Used  Substance Use Topics   Alcohol use: Yes    Comment: occ   Drug use: Yes    Types: Marijuana    Comment: 3 weeks ago    Family Medical History: Family History  Problem Relation Age of Onset   Ovarian cancer Mother    CAD Father      Physical Examination: @VITALWITHPAIN @  General: Patient is well developed, well nourished, calm, collected, and in no apparent distress. Attention to examination is appropriate.  Psychiatric: Patient is non-anxious.  Head:  Pupils equal, round, and reactive to light.  ENT:  Oral mucosa appears well hydrated.  Neck:   Supple.    Respiratory: Patient is breathing without any difficulty.  Extremities: No edema.  Vascular: Palpable dorsal pedal pulses.  Skin:   On exposed skin, there are no abnormal skin lesions.  NEUROLOGICAL:     Awake, alert, oriented to person, place, and time.  Speech is clear and fluent. Fund of knowledge is appropriate.   Cranial Nerves: Pupils equal round and reactive to light.  Facial tone is symmetric.   ROM of spine: Some tenderness palpation of cervical paraspinals.   Cannot supinate- no bicep  Hypo in [place] , at least 2+ in left  Strength:  Patient has chronic left upper extremity weakness and contracture from previous injury.  In regards to his right upper extremity has decreased deltoid function-patient attributes this to a rotator cuff injury.  Patient is unable to supinate to neutral and has very little bicep function.  Primarily using brachioradialis for elbow flexion.  Tricep with good strength he is approximately 4 in his right hand with grip and intrinsic weakness.  Hyporreflexic in upper extremity, trace right bicep reflex.  Least a 2+ in the left biceps and brachioradialis.  Medical Decision Making  Imaging: MRI CERVICAL SPINE WITHOUT CONTRAST 06/13/2024 04:47:32 AM   TECHNIQUE: Multiplanar multisequence MRI of the cervical spine was performed.   COMPARISON: Head CT 03/24/2018.   CLINICAL HISTORY: Myelopathy, acute, cervical spine; pain in R neck and trapezius with paresthesia RUE. Pt reports neck and upper back pain for the past few weeks, denies injury or fall. Pt reports intermittent numbness in hands.   FINDINGS:    BONES AND ALIGNMENT: Reversal of the normal cervical lordosis. Normal vertebral body heights. Bone marrow signal is unremarkable. No convincing marrow edema or acute osseous abnormality.   SPINAL CORD: Pronounced abnormal cervical spinal cord with long segment cervical spinal cord volume loss, and confluent abnormal cord T2 hyperintense heterogeneity from the base of the odontoid level through C4. Beginning at C5 and continuing into the visible upper thoracic spine the spinal cord signal and morphology appear more normal. Upper thoracic spinal canal is capacious. Axial images suggest a ventral CSF isointense fluid collection within the upper thoracic spinal canal such as subdural CSF or arachnoid cyst (series 12, image 40) measuring up to 6 mm in thickness. Mild thinning associated mass effect on the visible upper thoracic spinal cord where signal remains normal. Cervicomedullary junction has a more normal appearance. Grossly negative visible posterior fossa.   SOFT TISSUES: No paraspinal mass. Retropharyngeal course of both carotid arteries in the neck, normal variant. Preserved major vascular flow voids in the bilateral neck.     DEGENERATIVE:  Evidence of chronic cervical disc and endplate degeneration including disc bulging and endplate spurring. But there is no large disc or disc osteophyte complex, no significant cervical spinal stenosis in light of the decreased spinal cord volume.   There is moderate degenerative neural foraminal stenosis at the right C4 through C6 nerve levels.   IMPRESSION: 1. 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 This is nonspecific but might reflect sequelae of cord infarct or prior trauma. 2. Possible ventral upper thoracic spinal canal subdural or subarachnoid fluid collection with mild dorsal displacement of the upper thoracic cord, without significant cord mass effect. 3. Chronic cervical  disc and endplate degeneration resulting in Moderate neural foraminal stenosis at the right C4 through C6 nerve levels.   Flexion-extension x-rays were completed in clinic.  Will await final report   I have personally reviewed the images and agree with the above interpretation.  Assessment and Plan: Mr. Stemm is a pleasant 48 y.o. male with complicated past medical history including a previous cervical spinal cord injury and left upper extremity trauma which demonstrated a chronic contracture of his left arm.  He does have chronic neck pain but states that is gotten worse over the past 2 weeks with numbness and tingling down the right upper extremity mostly down the arm and into the hand.  On examination, it is detailed above.  He does have some chronic deltoid weakness.  I have some concern over his current bicep function and lack of supination and elbow flexion.  He does have some slight hand weakness as well on the right side.  I talked with the patient on optimizing his health currently including his A1c control and hypertension.  He is working with his endocrinologist on having this under better control.  Will send a message to the [facility] medical clinic in [place].  In the meantime, replacing referrals for physical therapy and injections.  Will discuss case further with Dr. Claudene and have patient come for follow-up in approximately 8 weeks.  Will update patient if this changes.  Red flag symptoms were reviewed with him.  Flexion-extension x-rays were completed today in clinic and will be reviewed once formal read is in.  Gabapentin was prescribed for his neuropathic pain.  The risk of this medication were discussed at length.   Thank you for involving me in the care of this patient.    Lyle Decamp, PA-C Dept. of Neurosurgery

## 2024-07-10 ENCOUNTER — Ambulatory Visit: Admitting: Physician Assistant

## 2024-07-10 ENCOUNTER — Encounter: Payer: Self-pay | Admitting: Physician Assistant

## 2024-07-10 ENCOUNTER — Ambulatory Visit

## 2024-07-10 VITALS — BP 130/74 | Ht 71.0 in | Wt 346.0 lb

## 2024-07-10 DIAGNOSIS — S14101S Unspecified injury at C1 level of cervical spinal cord, sequela: Secondary | ICD-10-CM | POA: Diagnosis not present

## 2024-07-10 DIAGNOSIS — M5010 Cervical disc disorder with radiculopathy, unspecified cervical region: Secondary | ICD-10-CM | POA: Diagnosis not present

## 2024-07-10 DIAGNOSIS — R200 Anesthesia of skin: Secondary | ICD-10-CM

## 2024-07-10 DIAGNOSIS — R52 Pain, unspecified: Secondary | ICD-10-CM

## 2024-07-10 DIAGNOSIS — M542 Cervicalgia: Secondary | ICD-10-CM

## 2024-07-10 DIAGNOSIS — S14101D Unspecified injury at C1 level of cervical spinal cord, subsequent encounter: Secondary | ICD-10-CM

## 2024-07-10 MED ORDER — GABAPENTIN 100 MG PO CAPS: 100.0000 mg | ORAL_CAPSULE | Freq: Three times a day (TID) | ORAL | 2 refills | Status: AC

## 2024-08-24 ENCOUNTER — Other Ambulatory Visit: Payer: Self-pay | Admitting: Physician Assistant

## 2024-08-24 DIAGNOSIS — S14101D Unspecified injury at C1 level of cervical spinal cord, subsequent encounter: Secondary | ICD-10-CM

## 2024-08-24 DIAGNOSIS — R202 Paresthesia of skin: Secondary | ICD-10-CM

## 2024-08-24 NOTE — Progress Notes (Signed)
 SABRA

## 2024-09-04 ENCOUNTER — Ambulatory Visit: Admitting: Physician Assistant

## 2024-09-05 ENCOUNTER — Encounter: Payer: Self-pay | Admitting: Neurosurgery

## 2024-09-05 ENCOUNTER — Ambulatory Visit

## 2024-09-05 ENCOUNTER — Ambulatory Visit (INDEPENDENT_AMBULATORY_CARE_PROVIDER_SITE_OTHER)

## 2024-09-05 ENCOUNTER — Ambulatory Visit (INDEPENDENT_AMBULATORY_CARE_PROVIDER_SITE_OTHER): Admitting: Neurosurgery

## 2024-09-05 VITALS — BP 142/100 | Ht 71.0 in | Wt 346.0 lb

## 2024-09-05 DIAGNOSIS — R202 Paresthesia of skin: Secondary | ICD-10-CM

## 2024-09-05 DIAGNOSIS — M25511 Pain in right shoulder: Secondary | ICD-10-CM

## 2024-09-05 DIAGNOSIS — M19011 Primary osteoarthritis, right shoulder: Secondary | ICD-10-CM | POA: Diagnosis not present

## 2024-09-05 DIAGNOSIS — S14101D Unspecified injury at C1 level of cervical spinal cord, subsequent encounter: Secondary | ICD-10-CM

## 2024-09-05 DIAGNOSIS — R200 Anesthesia of skin: Secondary | ICD-10-CM

## 2024-09-05 DIAGNOSIS — S14101S Unspecified injury at C1 level of cervical spinal cord, sequela: Secondary | ICD-10-CM

## 2024-09-05 DIAGNOSIS — M545 Low back pain, unspecified: Secondary | ICD-10-CM

## 2024-09-05 DIAGNOSIS — M5010 Cervical disc disorder with radiculopathy, unspecified cervical region: Secondary | ICD-10-CM | POA: Diagnosis not present

## 2024-09-05 NOTE — Patient Instructions (Signed)

## 2024-09-05 NOTE — Progress Notes (Signed)
 "  Referring Physician:  Jordan, [name], MD [address] [place],  [place] [address]  Primary Physician:  Jordan, [name], MD  Discussed the use of AI scribe software for clinical note transcription with the patient, who gave verbal consent to proceed.  History of Present Illness Carlos Hoeg. is a 49 year old male with chronic cervical spinal cord injury and prior right rotator cuff repair who presents with worsening right shoulder pain and right arm deficits.  He has constant numbness and pain in the right arm, partially relieved by medication but persistent. He feels pressure and a stuck sensation in the posterior right shoulder. He notes right arm weakness and is concerned about deterioration of his prior rotator cuff repair. He denies recent falls and denies new or worsening neck symptoms. He is aware of prior right shoulder x-ray imaging but does not know the results. He previously had right rotator cuff surgery with anchor placement and is worried about repair failure due to increasing pain and functional decline.  Low back pain is currently his most bothersome neurosurgical symptom. It is localized to the lower back without leg radiation. He has not done physical therapy for his low back. He receives injections for back pain with partial relief.  He has a chronic left hand contracture since his spinal cord injury. He reports no new symptoms in the left arm.  Bowel/Bladder Dysfunction: none  Conservative measures:  Physical therapy:  Has not participated in Multimodal medical therapy including regular antiinflammatories:  Medrol  Dosepak, cyclobenzaprine , ultram , ibuprofen  Injections:  No epidural steroid injections  Past Surgery: none   The symptoms are causing a significant impact on the patient's life.   Review of Systems:  A 10 point review of systems is negative, except for the pertinent positives and negatives detailed in the HPI.  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   colchicine 0.6 MG tablet Take 0.6 mg by mouth daily.   Continuous Glucose Sensor (DEXCOM G7 SENSOR) MISC SMARTSIG:Every 10 Days   gabapentin  (NEURONTIN ) 100 MG capsule Take 1 capsule (100 mg total) by mouth 3 (three) times daily.   ibuprofen  (ADVIL ) 200 MG tablet Take 200 mg by mouth every 6 (six) hours as needed.   insulin  NPH-regular Human (NOVOLIN 70/30) (70-30) 100 UNIT/ML injection Inject 30 Units into the skin 2 (two) times daily with a meal.   losartan  (COZAAR ) 50 MG tablet Take 50 mg by mouth daily.   Semaglutide,0.25 or 0.5MG /DOS, (OZEMPIC, 0.25 OR 0.5 MG/DOSE,) 2 MG/1.5ML SOPN Inject into the skin.   sildenafil (VIAGRA) 100 MG tablet Take 100 mg by mouth daily as needed.   testosterone cypionate (DEPOTESTOSTERONE CYPIONATE) 200 MG/ML injection Inject 200 mg into the muscle every 14 (fourteen) days.  traMADol  ([name] ) 50 MG tablet Take 1 tablet (50 mg total) by mouth every 6 (six) hours as needed for moderate pain.   No facility-administered encounter medications on file as of 09/05/2024.    Social History: Social History   Tobacco Use   Smoking status: Never   Smokeless tobacco: Never  Vaping Use   Vaping status: Never Used  Substance Use Topics   Alcohol use: Yes    Comment: occ   Drug use: Yes    Types:  Marijuana    Comment: 3 weeks ago    Family Medical History: Family History  Problem Relation Age of Onset   Ovarian cancer Mother    CAD Father     Physical Examination:  NEUROLOGICAL:     Awake, alert, oriented to person, place, and time.  Speech is clear and fluent. Fund of knowledge is appropriate.   Cranial Nerves: Pupils equal round and reactive to light.  Facial tone is symmetric.   ROM of spine: Some tenderness palpation of cervical paraspinals.  Hypo in [place] , at least 2+ in left  Strength:  Patient has chronic left upper extremity weakness and contracture from previous injury.  In regards to his right upper extremity has decreased deltoid function-patient attributes this to a rotator cuff injury. Limited PROM and AROM, severe pain while trying to keep arm elevated.  Patient has elbow flexion, however on resistance does have pain more proximal near the coracoid.  Does have some difficulty with supination, question biceps pathology.Tricep with good strength he is approximately 4 in his right hand with grip and intrinsic weakness.  Hyporreflexic in upper extremity, trace right bicep reflex.  Least a 2+ in the left biceps and brachioradialis.  Medical Decision Making  Imaging: MRI CERVICAL SPINE WITHOUT CONTRAST 06/13/2024 04:47:32 AM   TECHNIQUE: Multiplanar multisequence MRI of the cervical spine was performed.   COMPARISON: Head CT 03/24/2018.   CLINICAL HISTORY: Myelopathy, acute, cervical spine; pain in R neck and trapezius with paresthesia RUE. Pt reports neck and upper back pain for the past few weeks, denies injury or fall. Pt reports intermittent numbness in hands.   FINDINGS:   BONES AND ALIGNMENT: Reversal of the normal cervical lordosis. Normal vertebral body heights. Bone marrow signal is unremarkable. No convincing marrow edema or acute osseous abnormality.   SPINAL CORD: Pronounced abnormal cervical spinal cord with long segment cervical spinal  cord volume loss, and confluent abnormal cord T2 hyperintense heterogeneity from the base of the odontoid level through C4. Beginning at C5 and continuing into the visible upper thoracic spine the spinal cord signal and morphology appear more normal. Upper thoracic spinal canal is capacious. Axial images suggest a ventral CSF isointense fluid collection within the upper thoracic spinal canal such as subdural CSF or arachnoid cyst (series 12, image 40) measuring up to 6 mm in thickness. Mild thinning associated mass effect on the visible upper thoracic spinal cord where signal remains normal. Cervicomedullary junction has a more normal appearance. Grossly negative visible posterior fossa.   SOFT TISSUES: No paraspinal mass. Retropharyngeal course of both carotid arteries in the neck, normal variant. Preserved major vascular flow voids in the bilateral neck.     DEGENERATIVE:   Evidence of chronic cervical disc and endplate degeneration including disc bulging and endplate spurring. But there is no large disc or disc osteophyte complex, no significant cervical spinal stenosis in light of the decreased spinal cord volume.   There is moderate degenerative neural foraminal stenosis at the right  C4 through C6 nerve levels.   IMPRESSION: 1. 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 This is nonspecific but might reflect sequelae of cord infarct or prior trauma. 2. Possible ventral upper thoracic spinal canal subdural or subarachnoid fluid collection with mild dorsal displacement of the upper thoracic cord, without significant cord mass effect. 3. Chronic cervical disc and endplate degeneration resulting in Moderate neural foraminal stenosis at the right C4 through C6 nerve levels.   Flexion-extension x-rays were completed in clinic.  Will await final report   I have personally reviewed the images and agree with the above  interpretation.  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 Imaging demonstrates severe arthritis, increased bone formation, intra-articular bodies, and visible anchors from prior rotator cuff repair, raising concern for possible failure or progression of rotator cuff pathology. - Referred to orthopedic surgery (shoulder team) for further evaluation and management, they had opening and can see today at 3pm with Dr. Mariah  Chronic cervical spinal cord injury with bilateral arm deficits He has chronic bilateral arm numbness and weakness secondary to prior cervical spine trauma and spinal cord injury. Baseline left hand contracture. No evidence of new cervical cord compression or acute changes; deficits remain unchanged with persistent myelomalacia. No indication for surgical intervention at this time, pending orthopedic evaluation of the right shoulder.  Acute low back pain He reports new onset low back pain localized to the lumbar region without radicular symptoms. No prior physical therapy attempted. No evidence of acute neurological compromise. - Ordered physical therapy for low back pain. - Provided list of physical therapy groups for him to contact and initiate therapy.  Carlos LELON Sharps, MD "

## 2024-09-05 NOTE — Patient Instructions (Signed)

## 2024-09-05 NOTE — Progress Notes (Signed)
 "  Office Visit Note   Patient: Carlos Hall.           Date of Birth: 06/02/1976           MRN: [id number] Visit Date: 09/05/2024              Requested by: Jordan, [name], MD [address] [place],  [place] [address] PCP: Jordan, [name], MD   Assessment & Plan: Visit Diagnoses:  1. Arthritis of right shoulder   2. Acute pain of right shoulder     Plan: Natural history and expected course discussed. Questions answered. Patient with severe OA right shoulder; referred to Dr. Onesimo for surgical evaluation.  Orders:  Orders Placed This Encounter  Procedures   DG Shoulder Right     Subjective: Chief Complaint: Right shoulder pain  HPI Patient is a 49 y.o. male who complains of right shoulder pain. Onset of the symptoms was several years ago. Mechanism of injury: none. Patient has pain in the right shoulder and cannot lift the shoulder at all. Symptoms have been constant since that time. Prior history of related problems: previous shoulder injury: had a right rotator cuff tear for which he had rotator cuff surgery almost 30 years ago..  Objective: Vital Signs: There were no vitals taken for this visit.  Physical Exam Gen: Alert, No Acute Distress right shoulder: Skin intact, no erythema or induration noted. Well healed scar over anterior shoulder. Limited ROM. 0/5 abduction, 2/5 ER, 5/5 IR.   Imaging: Radiographs personally reviewed by me; reveal depression of humeral head, severe glenohumeral arthritis, and loose bodies in the glenohumeral joint of the right shoulder, as well as foreign bodies in humeral head typical of anchors used to repair the rotator cuff   PMFS History: Patient Active Problem List   Diagnosis Date Noted   Cervical disc disorder with radiculopathy of cervical region 06/13/2024   Numbness and tingling of right arm 06/13/2024   Neck pain 06/13/2024   C1-C4 level spinal cord injury (HCC) 06/13/2024   Paresthesias 06/13/2024   Encounter for  screening colonoscopy 06/06/2024   Sickle cell anemia (HCC) 01/27/2022   Cellulitis of left hand 08/11/2021   Hypertension associated with diabetes (HCC) 05/09/2019   Migraine headache 03/26/2018   Status migrainosus 03/24/2018   Blurry vision 03/21/2013   Low back pain 03/21/2013   Type 2 diabetes mellitus with hyperglycemia, with long-term current use of insulin  (HCC) 03/21/2013   Volume depletion 03/21/2013   Past Medical History:  Diagnosis Date   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    Diabetes mellitus without complication (HCC)    type 2   Hypertension    Sickle cell anemia (HCC)    traits    Family History  Problem Relation Age of Onset   Ovarian cancer Mother    CAD Father     Past Surgical History:  Procedure Laterality Date   COLONOSCOPY N/A 06/06/2024   Procedure: COLONOSCOPY;  Surgeon: [name], MD;  Location: [facility] ENDOSCOPY;  Service: General;  Laterality: N/A;   KNEE ARTHROSCOPY     ROTATOR CUFF REPAIR     Social History   Occupational History   Not on file  Tobacco Use   Smoking status: Never   Smokeless tobacco: Never  Vaping Use   Vaping status: Never Used  Substance and Sexual Activity   Alcohol use: Yes    Comment: occ   Drug use: Yes    Types: Marijuana  Comment: 3 weeks ago   Sexual activity: Yes   Current Outpatient Medications  Medication Instructions   allopurinol (ZYLOPRIM) 300 mg, Daily   colchicine 0.6 mg, Daily   Continuous Glucose Sensor (DEXCOM G7 SENSOR) MISC SMARTSIG:Every 10 Days   gabapentin  (NEURONTIN ) 100 mg, Oral, 3 times daily   ibuprofen  (ADVIL ) 200 mg, Every 6 hours PRN   insulin  NPH-regular Human (NOVOLIN 70/30) (70-30) 100 UNIT/ML injection 30 Units, Subcutaneous, 2 times daily with meals   losartan  (COZAAR ) 50 mg, Daily   Semaglutide,0.25 or 0.5MG /DOS, (OZEMPIC, 0.25 OR 0.5 MG/DOSE,) 2 MG/1.5ML SOPN Inject into the skin.   sildenafil (VIAGRA) 100 mg, Daily PRN   testosterone cypionate (DEPOTESTOSTERONE CYPIONATE)  200 mg, Every 14 days   traMADol  ([name] ) 50 mg, Oral, Every 6 hours PRN   Allergies as of 09/05/2024   (No Known Allergies)   "

## 2024-09-13 ENCOUNTER — Ambulatory Visit: Admitting: Orthopedic Surgery
